# Patient Record
Sex: Female | Born: 1955 | Race: Black or African American | Hispanic: No | Marital: Single | Smoking: Current every day smoker
Health system: Southern US, Community
[De-identification: ages and names within clinical notes are randomized; demographics above are authoritative.]

## PROBLEM LIST (undated history)

## (undated) DIAGNOSIS — R87619 Unspecified abnormal cytological findings in specimens from cervix uteri: Secondary | ICD-10-CM

## (undated) DIAGNOSIS — N200 Calculus of kidney: Secondary | ICD-10-CM

## (undated) DIAGNOSIS — M1710 Unilateral primary osteoarthritis, unspecified knee: Secondary | ICD-10-CM

## (undated) DIAGNOSIS — E049 Nontoxic goiter, unspecified: Secondary | ICD-10-CM

## (undated) DIAGNOSIS — H269 Unspecified cataract: Secondary | ICD-10-CM

## (undated) DIAGNOSIS — M179 Osteoarthritis of knee, unspecified: Secondary | ICD-10-CM

## (undated) DIAGNOSIS — I10 Essential (primary) hypertension: Secondary | ICD-10-CM

## (undated) HISTORY — DX: Calculus of kidney: N20.0

## (undated) HISTORY — DX: Unspecified abnormal cytological findings in specimens from cervix uteri: R87.619

## (undated) HISTORY — PX: THYROID SURGERY: SHX805

## (undated) HISTORY — PX: ANKLE ARTHROPLASTY: SUR68

## (undated) HISTORY — DX: Nontoxic goiter, unspecified: E04.9

## (undated) HISTORY — DX: Unspecified cataract: H26.9

## (undated) HISTORY — DX: Unilateral primary osteoarthritis, unspecified knee: M17.10

## (undated) HISTORY — DX: Osteoarthritis of knee, unspecified: M17.9

## (undated) HISTORY — DX: Essential (primary) hypertension: I10

## (undated) HISTORY — PX: ANKLE SURGERY: SHX546

## (undated) HISTORY — PX: KIDNEY STONE SURGERY: SHX686

## (undated) HISTORY — PX: KNEE SURGERY: SHX244

---

## 2017-01-29 HISTORY — PX: ANKLE ARTHROPLASTY: SUR68

## 2017-11-08 ENCOUNTER — Ambulatory Visit: Payer: Self-pay | Admitting: Family Medicine

## 2017-12-23 ENCOUNTER — Ambulatory Visit: Payer: Self-pay | Admitting: Family Medicine

## 2018-08-14 NOTE — Progress Notes (Addendum)
Virtual Visit via Video Note  I connected with Anna Pratt Pratt   on 08/15/18 at  9:00 AM EDT by a video enabled telemedicine application and verified that I am speaking with the correct person using two identifiers.  Location patient: home Location provider:work office Persons participating in the virtual visit: patient, provider  I discussed the limitations of evaluation and management by telemedicine and the availability of in person appointments. The patient expressed understanding and agreed to proceed.  Unable to complete video visit, so converted to telephone visit   Anna Pratt Pratt DOB: 1955/03/13 Encounter date: 08/15/2018  This is a 63 y.o. female who presents to establish care.  No chief complaint on file.   History of present illness: Moved here a year ago in August and needed to establish care. [place] Daughter got job as [employer] and had just had infant, so she moved here to be closer to daughter.   Due for refills on medication by end of month for thyroid. Last time she had bloodwork done was last year.   HTN: maxzide-25 daily. Doesn't check blood pressures at home.  Hypothyroid: takes name brand synthroid 200mcg daily. Knee arthritis - they are in process of evaluating for knee replacement. Follows with Dr. Eulah PontMurphy. Tries to limit naproxen because it was elevating blood pressures. Getting injections. Hasn't tried voltaren get but is using arthritis cream.   Had colonoscopy she thinks last year. She does have records which she will bring to next appointment.  Had mammogram last year.  Pap smear - thinks about 3 years ago.   Was walking a lot before moving here; but having harder time with this due to knee. Just moved to subdivision and now will start back with walking- just trying to do this on flat surface.   Past Medical History:  Diagnosis Date  . Hypertension   . Osteoarthritis, knee   . Thyroid goiter    Past Surgical History:  Procedure  Laterality Date  . ANKLE ARTHROPLASTY     done after injury  . CESAREAN SECTION    . KIDNEY STONE SURGERY    . THYROID SURGERY     due to goiter 32 years ago   No Known Allergies Current Meds  Medication Sig  . triamterene-hydrochlorothiazide (MAXZIDE-25) 37.5-25 MG tablet Take 1 tablet by mouth daily.  . [DISCONTINUED] Levothyroxine Sodium (SYNTHROID PO) Take by mouth.   Social History   Tobacco Use  . Smoking status: Former Smoker    Packs/day: 0.50    Years: 42.00    Pack years: 21.00    Types: Cigarettes  . Smokeless tobacco: Never Used  Substance Use Topics  . Alcohol use: Yes    Comment: socially   Family History  Problem Relation Age of Onset  . Stroke Father   . Heart attack Father   . Diabetes Maternal Grandmother   . Diabetes Mellitus II Maternal Aunt   . Healthy Daughter      Review of Systems  Constitutional: Negative for chills, fatigue and fever.  Respiratory: Negative for cough, chest tightness, shortness of breath and wheezing.   Cardiovascular: Negative for chest pain, palpitations and leg swelling.    Objective:  There were no vitals taken for this visit.      BP Readings from Last 3 Encounters:  No data found for BP   Wt Readings from Last 3 Encounters:  No data found for Wt    EXAM:  GENERAL: alert, oriented, sounds well and in no acute  distress LUNG: no shortness of breath on phone  Had to do telephone visit; so exam limited.  Assessment/Plan   1. Hypertension, unspecified type Continue current medication. Will check baseline bloodwork.  - CBC with Differential/Platelet; Future - Comprehensive metabolic panel; Future  2. Lipid screening - Lipid panel; Future  3. Screening for diabetes mellitus Family history  4. Hypothyroidism, unspecified type Will check bloodwork. - SYNTHROID 200 MCG tablet; Take 1 tablet (200 mcg total) by mouth daily before breakfast.  Dispense: 90 tablet; Refill: 1 - TSH; Future  5. Breast  cancer screening by mammogram - MM DIGITAL SCREENING BILATERAL; Future  Return for physical exam (will schedule after bloodwork).     I discussed the assessment and treatment plan with the patient. The patient was provided an opportunity to ask questions and all were answered. The patient agreed with the plan and demonstrated an understanding of the instructions.   The patient was advised to call back or seek an in-person evaluation if the symptoms worsen or if the condition fails to improve as anticipated.  I provided 28 minutes of non-face-to-face time during this encounter.   Micheline Rough, MD

## 2018-08-15 ENCOUNTER — Other Ambulatory Visit: Payer: Self-pay

## 2018-08-15 ENCOUNTER — Encounter: Payer: Self-pay | Admitting: Family Medicine

## 2018-08-15 ENCOUNTER — Telehealth: Payer: Self-pay | Admitting: *Deleted

## 2018-08-15 ENCOUNTER — Ambulatory Visit (INDEPENDENT_AMBULATORY_CARE_PROVIDER_SITE_OTHER): Payer: BC Managed Care – PPO | Admitting: Family Medicine

## 2018-08-15 DIAGNOSIS — I10 Essential (primary) hypertension: Secondary | ICD-10-CM | POA: Diagnosis not present

## 2018-08-15 DIAGNOSIS — Z1231 Encounter for screening mammogram for malignant neoplasm of breast: Secondary | ICD-10-CM

## 2018-08-15 DIAGNOSIS — Z131 Encounter for screening for diabetes mellitus: Secondary | ICD-10-CM

## 2018-08-15 DIAGNOSIS — Z13220 Encounter for screening for lipoid disorders: Secondary | ICD-10-CM

## 2018-08-15 DIAGNOSIS — E039 Hypothyroidism, unspecified: Secondary | ICD-10-CM | POA: Diagnosis not present

## 2018-08-15 MED ORDER — SYNTHROID 200 MCG PO TABS: 200.0000 ug | ORAL_TABLET | Freq: Every day | ORAL | 1 refills | Status: DC

## 2018-08-15 MED ORDER — NAPROXEN SODIUM 275 MG PO TABS: 275.0000 mg | ORAL_TABLET | Freq: Two times a day (BID) | ORAL | Status: DC

## 2018-08-15 NOTE — Telephone Encounter (Signed)
Appt scheduled for 7/24.

## 2018-08-15 NOTE — Telephone Encounter (Signed)
-----   Message from Caren Macadam, MD sent at 08/15/2018  9:39 AM EDT ----- Please schedule labwork when she is available

## 2018-08-22 ENCOUNTER — Other Ambulatory Visit: Payer: Self-pay

## 2018-08-22 ENCOUNTER — Other Ambulatory Visit (INDEPENDENT_AMBULATORY_CARE_PROVIDER_SITE_OTHER): Payer: BC Managed Care – PPO

## 2018-08-22 DIAGNOSIS — I10 Essential (primary) hypertension: Secondary | ICD-10-CM

## 2018-08-22 DIAGNOSIS — E039 Hypothyroidism, unspecified: Secondary | ICD-10-CM

## 2018-08-22 DIAGNOSIS — Z13220 Encounter for screening for lipoid disorders: Secondary | ICD-10-CM | POA: Diagnosis not present

## 2018-08-22 LAB — LIPID PANEL
Cholesterol: 177 mg/dL (ref 0–200)
HDL: 61.7 mg/dL (ref >39.00)
LDL Cholesterol: 99 mg/dL (ref 0–99)
NonHDL: 115.18
Total CHOL/HDL Ratio: 3
Triglycerides: 80 mg/dL (ref 0.0–149.0)
VLDL: 16 mg/dL (ref 0.0–40.0)

## 2018-08-22 LAB — COMPREHENSIVE METABOLIC PANEL
ALT: 20 U/L (ref 0–35)
AST: 22 U/L (ref 0–37)
Albumin: 4.4 g/dL (ref 3.5–5.2)
Alkaline Phosphatase: 73 U/L (ref 39–117)
BUN: 19 mg/dL (ref 6–23)
CO2: 29 mEq/L (ref 19–32)
Calcium: 9.7 mg/dL (ref 8.4–10.5)
Chloride: 103 mEq/L (ref 96–112)
Creatinine, Ser: 0.79 mg/dL (ref 0.40–1.20)
GFR: 88.8 mL/min (ref >60.00)
Glucose, Bld: 91 mg/dL (ref 70–99)
Potassium: 3.5 mEq/L (ref 3.5–5.1)
Sodium: 140 mEq/L (ref 135–145)
Total Bilirubin: 0.4 mg/dL (ref 0.2–1.2)
Total Protein: 7.4 g/dL (ref 6.0–8.3)

## 2018-08-22 LAB — CBC WITH DIFFERENTIAL/PLATELET
Basophils Absolute: 0 10*3/uL (ref 0.0–0.1)
Basophils Relative: 0.2 % (ref 0.0–3.0)
Eosinophils Absolute: 0.2 10*3/uL (ref 0.0–0.7)
Eosinophils Relative: 2.6 % (ref 0.0–5.0)
HCT: 42.5 % (ref 36.0–46.0)
Hemoglobin: 14.4 g/dL (ref 12.0–15.0)
Lymphocytes Relative: 31.2 % (ref 12.0–46.0)
Lymphs Abs: 2.6 10*3/uL (ref 0.7–4.0)
MCHC: 33.8 g/dL (ref 30.0–36.0)
MCV: 87.3 fl (ref 78.0–100.0)
Monocytes Absolute: 0.5 10*3/uL (ref 0.1–1.0)
Monocytes Relative: 5.7 % (ref 3.0–12.0)
Neutro Abs: 5.1 10*3/uL (ref 1.4–7.7)
Neutrophils Relative %: 60.3 % (ref 43.0–77.0)
Platelets: 241 10*3/uL (ref 150.0–400.0)
RBC: 4.87 Mil/uL (ref 3.87–5.11)
RDW: 14.5 % (ref 11.5–15.5)
WBC: 8.5 10*3/uL (ref 4.0–10.5)

## 2018-08-22 LAB — TSH: TSH: 0.08 u[IU]/mL — ABNORMAL LOW (ref 0.35–4.50)

## 2018-08-28 ENCOUNTER — Encounter: Payer: Self-pay | Admitting: Family Medicine

## 2018-10-31 ENCOUNTER — Other Ambulatory Visit (INDEPENDENT_AMBULATORY_CARE_PROVIDER_SITE_OTHER): Payer: BC Managed Care – PPO

## 2018-10-31 ENCOUNTER — Other Ambulatory Visit: Payer: Self-pay

## 2018-10-31 ENCOUNTER — Other Ambulatory Visit: Payer: BC Managed Care – PPO

## 2018-10-31 ENCOUNTER — Other Ambulatory Visit: Payer: Self-pay | Admitting: Family Medicine

## 2018-10-31 DIAGNOSIS — E039 Hypothyroidism, unspecified: Secondary | ICD-10-CM

## 2018-10-31 LAB — TSH: TSH: 0.26 u[IU]/mL — ABNORMAL LOW (ref 0.35–4.50)

## 2018-11-07 ENCOUNTER — Ambulatory Visit (INDEPENDENT_AMBULATORY_CARE_PROVIDER_SITE_OTHER): Payer: BC Managed Care – PPO | Admitting: Family Medicine

## 2018-11-07 ENCOUNTER — Encounter: Payer: Self-pay | Admitting: Family Medicine

## 2018-11-07 ENCOUNTER — Other Ambulatory Visit: Payer: Self-pay

## 2018-11-07 VITALS — BP 140/80 | HR 76 | Temp 97.6°F | Ht 61.0 in | Wt 190.3 lb

## 2018-11-07 DIAGNOSIS — I10 Essential (primary) hypertension: Secondary | ICD-10-CM

## 2018-11-07 DIAGNOSIS — E039 Hypothyroidism, unspecified: Secondary | ICD-10-CM | POA: Diagnosis not present

## 2018-11-07 DIAGNOSIS — Z0000 Encounter for general adult medical examination without abnormal findings: Secondary | ICD-10-CM | POA: Diagnosis not present

## 2018-11-07 DIAGNOSIS — Z720 Tobacco use: Secondary | ICD-10-CM | POA: Diagnosis not present

## 2018-11-07 LAB — TSH: TSH: 0.23 u[IU]/mL — ABNORMAL LOW (ref 0.35–4.50)

## 2018-11-07 MED ORDER — BUPROPION HCL ER (SR) 150 MG PO TB12: ORAL_TABLET | ORAL | 2 refills | Status: DC

## 2018-11-07 NOTE — Patient Instructions (Addendum)
Please check your blood pressures at home and keep me updated with how these look in about 2 weeks time.    Discussed negative health consequences of smoking and benefits of smoking cessation with patient in detail. Discussed medical treatment options including patches, gums, zyban, and chantix. Encouraged to let me know if any concerns or if needing help with quitting. 1-800-QUIT-NOW also available to help through quitting process.

## 2018-11-07 NOTE — Progress Notes (Signed)
Anna Pratt DOB: [birth year] Encounter date: 11/07/2018  This is a 63 y.o. female who presents for complete physical   History of present illness/Additional concerns: Doing pretty well overall. Just planning to get back to walking and wants to lose weight again.   From last visit: HTN: maxzide-25 daily. Doesn't check blood pressures at home. Surprised by the 140 today; that is high for her.   Hypothyroid: takes name brand synthroid daily.  Knee arthritis - they are in process of evaluating for knee replacement. Follows with Dr. Eulah Pont. Tries to limit naproxen because it was elevating blood pressures. Getting injections. Hasn't tried voltaren get but is using arthritis cream. Thinks she is going to try to wait until she is 65.   Had colonoscopy she thinks last year. She brought all records here and they were copied. I have not yet seen/reviewed these.  Had mammogram last year.  Mammogram was ordered at last visit, but she states she did not hear about this.  Telephone number for [facility] center given to her today so she can call to set up mammogram since order is still in the system.  Pap smear - thinks about 3 years ago (this was just pelvic exam and may not have been pap smear). She would like to hold off on pap smear today so we will review previous records and then can give her future appointment.   Tried chantix in past; didn't feel like it helped her when she took it before.   Past Medical History:  Diagnosis Date  . Hypertension   . Osteoarthritis, knee   . Thyroid goiter    Past Surgical History:  Procedure Laterality Date  . ANKLE ARTHROPLASTY     done after injury  . CESAREAN SECTION    . KIDNEY STONE SURGERY    . THYROID SURGERY     due to goiter 32 years ago   No Known Allergies Current Meds  Medication Sig  . Omega-3 Fatty Acids (FISH OIL PO) Take by mouth.   Social History   Tobacco Use  . Smoking status: Current Every  Day Smoker    Packs/day: 0.50    Years: 42.00    Pack years: 21.00    Types: Cigarettes  . Smokeless tobacco: Never Used  Substance Use Topics  . Alcohol use: Yes    Comment: socially   Family History  Problem Relation Age of Onset  . Stroke Father   . Heart attack Father   . Diabetes Maternal Grandmother   . Diabetes Mellitus II Maternal Aunt   . Healthy Daughter      Review of Systems  Constitutional: Negative for activity change, appetite change, chills, fatigue, fever and unexpected weight change.  HENT: Negative for congestion, ear pain, hearing loss, sinus pressure, sinus pain, sore throat and trouble swallowing.   Eyes: Negative for pain and visual disturbance.  Respiratory: Negative for cough, chest tightness, shortness of breath and wheezing.   Cardiovascular: Negative for chest pain, palpitations and leg swelling.  Gastrointestinal: Negative for abdominal pain, blood in stool, constipation, diarrhea, nausea and vomiting.  Genitourinary: Negative for difficulty urinating and menstrual problem.  Musculoskeletal: Negative for arthralgias and back pain.  Skin: Negative for rash.  Neurological: Negative for dizziness, weakness, numbness and headaches.  Hematological: Negative for adenopathy. Does not bruise/bleed easily.  Psychiatric/Behavioral: Negative for sleep disturbance and suicidal ideas. The patient is not nervous/anxious.     CBC:  Lab Results  Component Value Date  WBC 8.5 08/22/2018   HGB 14.4 08/22/2018   HCT 42.5 08/22/2018   MCHC 33.8 08/22/2018   RDW 14.5 08/22/2018   PLT 241.0 08/22/2018   CMP: Lab Results  Component Value Date   NA 140 08/22/2018   K 3.5 08/22/2018   CL 103 08/22/2018   CO2 29 08/22/2018   GLUCOSE 91 08/22/2018   BUN 19 08/22/2018   CREATININE 0.79 08/22/2018   CALCIUM 9.7 08/22/2018   PROT 7.4 08/22/2018   BILITOT 0.4 08/22/2018   ALKPHOS 73 08/22/2018   ALT 20 08/22/2018   AST 22 08/22/2018   LIPID: Lab Results   Component Value Date   CHOL 177 08/22/2018   TRIG 80.0 08/22/2018   HDL 61.70 08/22/2018   LDLCALC 99 08/22/2018    Objective:  BP 140/80 (BP Location: Left Arm, Patient Position: Sitting, Cuff Size: Large)   Pulse 76   Temp 97.6 F (36.4 C) (Temporal)   Ht 5\' 1"  (1.549 m)   Wt 190 lb 4.8 oz (86.3 kg)   SpO2 98%   BMI 35.96 kg/m   Weight: 190 lb 4.8 oz (86.3 kg)   BP Readings from Last 3 Encounters:  11/07/18 140/80   Wt Readings from Last 3 Encounters:  11/07/18 190 lb 4.8 oz (86.3 kg)    Physical Exam Constitutional:      General: She is not in acute distress.    Appearance: She is well-developed.  HENT:     Head: Normocephalic and atraumatic.     Right Ear: External ear normal.     Left Ear: External ear normal.     Mouth/Throat:     Pharynx: No oropharyngeal exudate.  Eyes:     Conjunctiva/sclera: Conjunctivae normal.     Pupils: Pupils are equal, round, and reactive to light.  Neck:     Musculoskeletal: Normal range of motion and neck supple.     Thyroid: No thyromegaly.  Cardiovascular:     Rate and Rhythm: Normal rate and regular rhythm.     Heart sounds: Normal heart sounds. No murmur. No friction rub. No gallop.   Pulmonary:     Effort: Pulmonary effort is normal.     Breath sounds: Normal breath sounds.  Abdominal:     General: Bowel sounds are normal. There is no distension.     Palpations: Abdomen is soft. There is no mass.     Tenderness: There is no abdominal tenderness. There is no guarding.     Hernia: No hernia is present.  Musculoskeletal: Normal range of motion.        General: No tenderness or deformity.  Lymphadenopathy:     Cervical: No cervical adenopathy.  Skin:    General: Skin is warm and dry.     Findings: No rash.     Comments: Left anterior mid lower leg - 0.5cm dark brown mole, regular shaped. Right buttock 0.5cm dark brown mole  Neurological:     Mental Status: She is alert and oriented to person, place, and time.      Deep Tendon Reflexes: Reflexes normal.     Reflex Scores:      Tricep reflexes are 2+ on the right side and 2+ on the left side.      Bicep reflexes are 2+ on the right side and 2+ on the left side.      Brachioradialis reflexes are 2+ on the right side and 2+ on the left side.      Patellar reflexes are  2+ on the right side and 2+ on the left side. Psychiatric:        Speech: Speech normal.        Behavior: Behavior normal.        Thought Content: Thought content normal.     Assessment/Plan: Health Maintenance Due  Topic Date Due  . PAP SMEAR-Modifier  01/30/2016   Health Maintenance reviewed.  1. Preventative health care Number given for her to call and schedule mammogram.   2. Hypothyroidism, unspecified type We will recheck thyroid today to make sure new dosing is keeping her in normal range.  3. Hypertension, unspecified type Blood pressure is slightly high today.  Have asked her to check at home for 2 weeks and report numbers back.  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  4. Tobacco abuse We discussed smoking.  She does have some desire to quit.  She has quit in the past for 2 years and did this using e-cigarettes.  She has tried Chantix and did not feel like it worked very well for her.  We discussed trying Wellbutrin today and she is willing to try this.  Discussed cutting down smoking to her "critical" cigarettes a day and then starting with the Wellbutrin.    Return for bp update in 2 weeks time.  Theodis ShoveJunell Elihu Milstein, MD

## 2018-11-08 ENCOUNTER — Other Ambulatory Visit: Payer: Self-pay | Admitting: Family Medicine

## 2018-11-08 MED ORDER — LEVOTHYROXINE SODIUM 150 MCG PO TABS: 150.0000 ug | ORAL_TABLET | Freq: Every day | ORAL | 3 refills | Status: DC

## 2018-11-10 ENCOUNTER — Other Ambulatory Visit: Payer: Self-pay | Admitting: Family Medicine

## 2018-11-10 MED ORDER — SYNTHROID 150 MCG PO TABS: 150.0000 ug | ORAL_TABLET | Freq: Every day | ORAL | 1 refills | Status: DC

## 2018-11-10 NOTE — Addendum Note (Signed)
Addended by: Agnes Lawrence on: 11/10/2018 11:40 AM   Modules accepted: Orders

## 2018-11-12 ENCOUNTER — Telehealth: Payer: Self-pay | Admitting: *Deleted

## 2018-11-12 NOTE — Telephone Encounter (Signed)
Please get details of this. We did discuss knee pain at visit. Ideally a note limiting duties related to joint would come from specialist treating her for this? But if they are not willing to complete I can try to help with more information.

## 2018-11-12 NOTE — Telephone Encounter (Signed)
I called the pt and informed her of the message below.  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

## 2018-11-12 NOTE — Telephone Encounter (Signed)
Copied from [place] [id number]. Topic: General - Other >> Nov 12, 2018  9:54 AM Anna Pratt wrote: Reason for CRM: Pt has arthritis in her knees and cant perform certain duties at work and was advised by employer to get a DR. Note / please call Pt for more information for the note

## 2018-11-12 NOTE — Telephone Encounter (Signed)
Sounds good. I do not think ortho will have issue writing note for her at that visit.

## 2018-12-05 ENCOUNTER — Telehealth (INDEPENDENT_AMBULATORY_CARE_PROVIDER_SITE_OTHER): Payer: BC Managed Care – PPO | Admitting: Family Medicine

## 2018-12-05 ENCOUNTER — Encounter: Payer: Self-pay | Admitting: Family Medicine

## 2018-12-05 ENCOUNTER — Other Ambulatory Visit: Payer: Self-pay

## 2018-12-05 VITALS — BP 116/77 | HR 81

## 2018-12-05 DIAGNOSIS — I10 Essential (primary) hypertension: Secondary | ICD-10-CM | POA: Insufficient documentation

## 2018-12-05 DIAGNOSIS — E039 Hypothyroidism, unspecified: Secondary | ICD-10-CM

## 2018-12-05 DIAGNOSIS — Z01818 Encounter for other preprocedural examination: Secondary | ICD-10-CM

## 2018-12-05 DIAGNOSIS — E890 Postprocedural hypothyroidism: Secondary | ICD-10-CM | POA: Insufficient documentation

## 2018-12-05 DIAGNOSIS — Z720 Tobacco use: Secondary | ICD-10-CM

## 2018-12-05 NOTE — Progress Notes (Signed)
Virtual Visit via Video Note  I connected with Anna Pratt  on 12/05/18 at 11:00 AM EST by a video enabled telemedicine application and verified that I am speaking with the correct person using two identifiers.  Location patient: home Location provider:work or home office Persons participating in the virtual visit: patient, provider  I discussed the limitations of evaluation and management by telemedicine and the availability of in person appointments. The patient expressed understanding and agreed to proceed.   [place] Date of Birth:  10-Dec-1955  This patient presents today for a preoperative consultation at the request of surgeon, Dr. Percell Miller, who plans on performing right total knee replacement on date: To be determined.  She was asked to have a follow-up visit before preoperative paperwork to be completed because her blood pressure has been elevated at her last visit.  Just can't wait any longer for knee surgery; ready to get this done.   Doing well with smoking in generally - doing 2-3 cig/day. She is doing the wellbutrin.   Checked blood pressure this morning when Wendie Simmer called: 117/77. Recheck while on the phone: 131/84 HR 79  Planned anesthesia: general  Known anesthesia problems: Negative  Bleeding risk: none Personal or FH of DVT/PE: none    Patient Active Problem List   Diagnosis Date Noted  . Hypertension 12/05/2018  . Hypothyroid 12/05/2018   Past Surgical History:  Procedure Laterality Date  . ANKLE ARTHROPLASTY     done after injury  . CESAREAN SECTION    . KIDNEY STONE SURGERY    . THYROID SURGERY     due to goiter 32 years ago    No Known Allergies Current Meds  Medication Sig  . Omega-3 Fatty Acids (FISH OIL PO) Take by mouth.  . SYNTHROID 150 MCG tablet Take 1 tablet (150 mcg total) by mouth daily before breakfast.  . triamterene-hydrochlorothiazide (MAXZIDE-25) 37.5-25 MG tablet Take 1 tablet by mouth daily.     Social History   Tobacco Use  . Smoking status: Current Every Day Smoker    Packs/day: 0.50    Years: 42.00    Pack years: 21.00    Types: Cigarettes  . Smokeless tobacco: Never Used  Substance Use Topics  . Alcohol use: Yes    Comment: socially   Family History  Problem Relation Age of Onset  . Stroke Father   . Heart attack Father   . Diabetes Maternal Grandmother   . Diabetes Mellitus II Maternal Aunt   . Healthy Daughter     Review of Systems Review of Systems - General ROS: negative for - chills, fatigue, fever or hot flashes ENT ROS: negative for - nasal congestion, nasal discharge, sinus pain or sore throat Respiratory ROS: negative for - cough or shortness of breath Cardiovascular ROS: no chest pain or dyspnea on exertion Musculoskeletal ROS: positive for - joint pain   Recent Labs: CBC:  Lab Results  Component Value Date   WBC 8.5 08/22/2018   HGB 14.4 08/22/2018   HCT 42.5 08/22/2018   MCHC 33.8 08/22/2018   RDW 14.5 08/22/2018   PLT 241.0 08/22/2018   CMP:  Lab Results  Component Value Date   NA 140 08/22/2018   K 3.5 08/22/2018   CL 103 08/22/2018   CO2 29 08/22/2018   GLUCOSE 91 08/22/2018   BUN 19 08/22/2018   CREATININE 0.79 08/22/2018   CALCIUM 9.7 08/22/2018   PROT 7.4 08/22/2018   BILITOT 0.4 08/22/2018   ALKPHOS  73 08/22/2018   ALT 20 08/22/2018   AST 22 08/22/2018    HBA1C: No results found for: LABA1C, EAG  Objective:   BP 116/77   Pulse 81     Physical Exam   EKG Interpretation: virtual exam; based on medical history, EKG not indicated.  Lab Review: stable in July.    Assessment:   [name] was seen today for medical clearance.  Diagnoses and all orders for this visit:  Encounter for preoperative assessment  Essential hypertension: stable on current medication.   Hypothyroidism, unspecified type: stable on synthroid  Tobacco abuse: she is going to work on cutting out final cigarettes. Has decreased  amount smoking significantly.   63 y.o.patient  approved for Surgery     Plan:   1. Preoperative workup as follows:none 2. Change in medication regimen before surgery: none 3. No contraindications to planned surgery:we discussed benefits of entirely quitting smoking prior to surgery. She understands this and is going to work to cut out final 2-3 cig/day ASAP to promote improved breathing and wound healing.    lower than normal medical risk using [employer] guildeline of perioperative cardiovascular evaluation  Note electronically signed by provider.  Theodis Shove, MD   I discussed the assessment and treatment plan with the patient. The patient was provided an opportunity to ask questions and all were answered. The patient agreed with the plan and demonstrated an understanding of the instructions.   The patient was advised to call back or seek an in-person evaluation if the symptoms worsen or if the condition fails to improve as anticipated.  I provided 12 minutes of non-face-to-face time during this encounter.   Theodis Shove, MD

## 2018-12-19 ENCOUNTER — Ambulatory Visit: Payer: Self-pay | Admitting: *Deleted

## 2018-12-19 NOTE — Telephone Encounter (Signed)
I called patient back. She is feeling fine, but bp still elevated at 170/90. Pain level is about 6-7/10 right now. She is taking pain medication every 4 hours currently.  I advised her to take an extra Maxide this evening and recheck her blood pressure in 2 hours.  She can report these numbers at her virtual visit tomorrow.  We may need to either increase the Maxide dose or add on an additional medication.  We discussed that blood pressure can change with fluid changes as well as pain postoperatively.  She will continue to monitor.

## 2018-12-19 NOTE — Telephone Encounter (Signed)
Patient scheduled for 12/20/2018

## 2018-12-19 NOTE — Telephone Encounter (Signed)
Pt called with having an elevated b/p after knee surgery yesterday.  Her systolic has been up to 759 while she was in the medical facility following her surgery. Today at physical therapy her systolic was between 163 and 178.  She rechecked her b/p now and it is 172/91. She denies headache, chest pain, shortness of breath, blurred vision or weakness. She is having some pain from her knee. And she has not missed any doses of her b/p medication. Per protocol she should be seen within 3 days. Virtual appointment scheduled for tomorrow. She is also advised to have her list of medications for the provider to see. She has meds that have been ordered for her to take after her surgery. She is advised that if she starts having symptoms mentioned above with elevated b/p, she should go to the hospital. She voiced understanding. Reason for Disposition . Systolic BP  >= 846 OR Diastolic >= 659  Answer Assessment - Initial Assessment Questions 1. BLOOD PRESSURE: "What is the blood pressure?" "Did you take at least two measurements 5 minutes apart?"     178/100 when leaving therapy and it was 162/94 this morning 2. ONSET: "When did you take your blood pressure?"     today 3. HOW: "How did you obtain the blood pressure?" (e.g., visiting nurse, automatic home BP monitor)     Automatic home BP 4. HISTORY: "Do you have a history of high blood pressure?"     yes 5. MEDICATIONS: "Are you taking any medications for blood pressure?" "Have you missed any doses recently?"     On b/p medicaton 6. OTHER SYMPTOMS: "Do you have any symptoms?" (e.g., headache, chest pain, blurred vision, difficulty breathing, weakness)     no 7. PREGNANCY: "Is there any chance you are pregnant?" "When was your last menstrual period?"     N/a  Protocols used: HIGH BLOOD PRESSURE-A-AH

## 2018-12-20 ENCOUNTER — Ambulatory Visit (INDEPENDENT_AMBULATORY_CARE_PROVIDER_SITE_OTHER): Payer: BC Managed Care – PPO | Admitting: Family Medicine

## 2018-12-20 ENCOUNTER — Encounter: Payer: Self-pay | Admitting: Family Medicine

## 2018-12-20 VITALS — BP 159/88 | HR 89 | Ht 61.0 in | Wt 185.0 lb

## 2018-12-20 DIAGNOSIS — I10 Essential (primary) hypertension: Secondary | ICD-10-CM

## 2018-12-20 MED ORDER — AMLODIPINE BESYLATE 5 MG PO TABS: 5.0000 mg | ORAL_TABLET | Freq: Every day | ORAL | 2 refills | Status: DC

## 2018-12-20 NOTE — Progress Notes (Signed)
Virtual Visit via Video Note  I connected with Anna Pratt on 12/20/18 at  9:40 AM EST by a video enabled telemedicine application and verified that I am speaking with the correct person using two identifiers.  Location: Patient: home with daughter  Provider: home    I discussed the limitations of evaluation and management by telemedicine and the availability of in person appointments. The patient expressed understanding and agreed to proceed.  History of Present Illness: Pt is home c/o bp running high--- it started the day of her knee replacement and con't during hosp stay.  She was not having pain at the time.  She used to take other meds but it was stopped when she lost weight and bp came down.  She does not remember what it was    No HA, no cp, no sob  No other complaints--- pain in knee is pretty well controlled    Observations/Objective: Vitals:   12/20/18 0934  BP: (!) 159/88  Pulse: 89   Pt is in NAD  Assessment and Plan: 1. Essential hypertension con't maxzide and add norvasc F/u pcp in 2-3 weeks or sooner prn  - amLODipine (NORVASC) 5 MG tablet; Take 1 tablet (5 mg total) by mouth daily.  Dispense: 30 tablet; Refill: 2  Poorly controlled will alter medications, encouraged DASH diet, minimize caffeine and obtain adequate sleep. Report concerning symptoms and follow up as directed and as needed  Follow Up Instructions:    I discussed the assessment and treatment plan with the patient. The patient was provided an opportunity to ask questions and all were answered. The patient agreed with the plan and demonstrated an understanding of the instructions.   The patient was advised to call back or seek an in-person evaluation if the symptoms worsen or if the condition fails to improve as anticipated.  I provided 15 minutes of non-face-to-face time during this encounter.   Ann Held, DO

## 2019-01-12 ENCOUNTER — Other Ambulatory Visit: Payer: BC Managed Care – PPO

## 2019-02-02 ENCOUNTER — Other Ambulatory Visit: Payer: BC Managed Care – PPO

## 2019-02-03 ENCOUNTER — Other Ambulatory Visit: Payer: Self-pay

## 2019-02-03 ENCOUNTER — Other Ambulatory Visit (INDEPENDENT_AMBULATORY_CARE_PROVIDER_SITE_OTHER): Payer: BC Managed Care – PPO

## 2019-02-03 DIAGNOSIS — E039 Hypothyroidism, unspecified: Secondary | ICD-10-CM | POA: Diagnosis not present

## 2019-02-03 LAB — TSH: TSH: 6.51 u[IU]/mL — ABNORMAL HIGH (ref 0.35–4.50)

## 2019-02-10 MED ORDER — SYNTHROID 175 MCG PO TABS: 175.0000 ug | ORAL_TABLET | Freq: Every day | ORAL | 5 refills | Status: DC

## 2019-03-10 ENCOUNTER — Other Ambulatory Visit: Payer: Self-pay

## 2019-03-10 ENCOUNTER — Encounter: Payer: Self-pay | Admitting: Family Medicine

## 2019-03-10 ENCOUNTER — Telehealth (INDEPENDENT_AMBULATORY_CARE_PROVIDER_SITE_OTHER): Payer: BC Managed Care – PPO | Admitting: Family Medicine

## 2019-03-10 ENCOUNTER — Telehealth: Payer: Self-pay | Admitting: Family Medicine

## 2019-03-10 VITALS — Temp 100.1°F

## 2019-03-10 DIAGNOSIS — R0981 Nasal congestion: Secondary | ICD-10-CM

## 2019-03-10 DIAGNOSIS — R52 Pain, unspecified: Secondary | ICD-10-CM | POA: Diagnosis not present

## 2019-03-10 DIAGNOSIS — R509 Fever, unspecified: Secondary | ICD-10-CM

## 2019-03-10 MED ORDER — OSELTAMIVIR PHOSPHATE 75 MG PO CAPS: 75.0000 mg | ORAL_CAPSULE | Freq: Two times a day (BID) | ORAL | 0 refills | Status: DC

## 2019-03-10 NOTE — Progress Notes (Signed)
Virtual Visit via Video Note  I connected with Anna Pratt  on 03/10/19 at  5:20 PM EST by a video enabled telemedicine application and verified that I am speaking with the correct person using two identifiers.  Location patient: home Location provider:work or home office Persons participating in the virtual visit: patient, provider  I discussed the limitations of evaluation and management by telemedicine and the availability of in person appointments. The patient expressed understanding and agreed to proceed.   HPI:  Acute visit for sinus congestion: -started yesterday -symptoms: sinus congestion, low grade temp of 100.1 today, tired, body aches, loose bowel - mild, mild HA yesterday -denies: sob, cough, stuck in bed, weakness, CP, diarrhea, NV, loss of taste or smell -daughter had some sinus issues a few days ago - she works at the [employer] - she is getting a Therapist, occupational -pt goes to grocery store only - wears mask and socially distances -grandchild goes to another lady's house for daycare - no other children there -denies lung disease, diabetes or immune issues -reports does have HTN, but well controlled and thyroid dz  ROS: See pertinent positives and negatives per HPI.  Past Medical History:  Diagnosis Date  . Hypertension   . Osteoarthritis, knee   . Thyroid goiter     Past Surgical History:  Procedure Laterality Date  . ANKLE ARTHROPLASTY     done after injury  . CESAREAN SECTION    . KIDNEY STONE SURGERY    . THYROID SURGERY     due to goiter 32 years ago    Family History  Problem Relation Age of Onset  . Stroke Father   . Heart attack Father   . Diabetes Maternal Grandmother   . Diabetes Mellitus II Maternal Aunt   . Healthy Daughter     SOCIAL HX: see hpi   Current Outpatient Medications:  .  amLODipine (NORVASC) 5 MG tablet, Take 1 tablet (5 mg total) by mouth daily., Disp: 30 tablet, Rfl: 2 .  Omega-3 Fatty Acids (FISH OIL PO), Take by  mouth., Disp: , Rfl:  .  SYNTHROID 175 MCG tablet, Take 1 tablet (175 mcg total) by mouth daily before breakfast., Disp: 30 tablet, Rfl: 5 .  triamterene-hydrochlorothiazide (MAXZIDE-25) 37.5-25 MG tablet, Take 1 tablet by mouth daily., Disp: , Rfl:  .  oseltamivir (TAMIFLU) 75 MG capsule, Take 1 capsule (75 mg total) by mouth 2 (two) times daily., Disp: 10 capsule, Rfl: 0  EXAM:  VITALS per patient if applicable:see hpi  GENERAL: alert, oriented, appears well and in no acute distress  HEENT: atraumatic, conjunttiva clear, no obvious abnormalities on inspection of external nose and ears  NECK: normal movements of the head and neck  LUNGS: on inspection no signs of respiratory distress, breathing rate appears normal, no obvious gross SOB, gasping or wheezing  CV: no obvious cyanosis  MS: moves all visible extremities without noticeable abnormality  PSYCH/NEURO: pleasant and cooperative, no obvious depression or anxiety, speech and thought processing grossly intact  ASSESSMENT AND PLAN:  Discussed the following assessment and plan:  Sinus congestion  Fever and chills  Body aches  -we discussed possible serious and likely etiologies, options for evaluation and workup, limitations of telemedicine visit vs in person visit, treatment, treatment risks and precautions. Pt prefers to treat via telemedicine empirically rather then risking or undertaking an in person visit at this moment. Could have VURI, mild influenza, COVID19 vs other - likely from daughter whom has had symptoms. Discussed options.  Pt wants rx for tamiflu. She plans to do COVID19 testing and is considering [facility] vs a pharmacy. She agrees to home isolation. Discussed limitations of testing, options for treatment, symptomatic care, potential complications. Patient agrees to seek prompt in person care or follow up with PCP if worsening, new symptoms arise, or if is not improving with treatment. Agrees to follow up with PCP if  testing positive and wants to pursue mab.   I discussed the assessment and treatment plan with the patient. The patient was provided an opportunity to ask questions and all were answered. The patient agreed with the plan and demonstrated an understanding of the instructions.   The patient was advised to call back or seek an in-person evaluation if the symptoms worsen or if the condition fails to improve as anticipated.   Lucretia Kern, DO

## 2019-03-12 ENCOUNTER — Other Ambulatory Visit: Payer: Self-pay

## 2019-03-12 ENCOUNTER — Telehealth (INDEPENDENT_AMBULATORY_CARE_PROVIDER_SITE_OTHER): Payer: BC Managed Care – PPO | Admitting: Family Medicine

## 2019-03-12 ENCOUNTER — Encounter: Payer: Self-pay | Admitting: Family Medicine

## 2019-03-12 VITALS — BP 119/76 | HR 101 | Temp 99.7°F

## 2019-03-12 DIAGNOSIS — R0981 Nasal congestion: Secondary | ICD-10-CM | POA: Diagnosis not present

## 2019-03-12 DIAGNOSIS — R52 Pain, unspecified: Secondary | ICD-10-CM | POA: Diagnosis not present

## 2019-03-12 DIAGNOSIS — R509 Fever, unspecified: Secondary | ICD-10-CM

## 2019-03-12 NOTE — Progress Notes (Signed)
Virtual Visit via Video Note  I connected with Anna Pratt  on 03/12/19 at 10:00 AM EST by a video enabled telemedicine application and verified that I am speaking with the correct person using two identifiers.  Location patient: home Location provider:work or home office Persons participating in the virtual visit: patient, provider  I discussed the limitations of evaluation and management by telemedicine and the availability of in person appointments. The patient expressed understanding and agreed to proceed.   HPI:  Acute visit for concerns about COVID19: -symptoms started on Feb 8 -symptoms include sinus congestion, loose stool initially, mild body aches, low grade temps - highest 100.6 -see note from a few days ago, pt denies worsening symptoms -she had a covid test on the 9th, negative, but she wants to test again as she is most concerned about possible COVID -she was given tamiflu, but did not take it -temp today 100 -denies NVD, cough, sinus pain, discolored mucus, SOB, dysuria, inability to get out of bed, CP -daughter ha similar symptoms - she works at the [employer] - she was COVID tested as well -pt goes to grocery store only - wears mask and socially distances -grandchild goes to another lady's house for daycare - no other children there  ROS: See pertinent positives and negatives per HPI.  Past Medical History:  Diagnosis Date  . Hypertension   . Osteoarthritis, knee   . Thyroid goiter     Past Surgical History:  Procedure Laterality Date  . ANKLE ARTHROPLASTY     done after injury  . CESAREAN SECTION    . KIDNEY STONE SURGERY    . THYROID SURGERY     due to goiter 32 years ago    Family History  Problem Relation Age of Onset  . Stroke Father   . Heart attack Father   . Diabetes Maternal Grandmother   . Diabetes Mellitus II Maternal Aunt   . Healthy Daughter     SOCIAL HX: see hpi   Current Outpatient Medications:  .  amLODipine (NORVASC) 5 MG  tablet, Take 1 tablet (5 mg total) by mouth daily., Disp: 30 tablet, Rfl: 2 .  Omega-3 Fatty Acids (FISH OIL PO), Take by mouth., Disp: , Rfl:  .  oseltamivir (TAMIFLU) 75 MG capsule, Take 1 capsule (75 mg total) by mouth 2 (two) times daily., Disp: 10 capsule, Rfl: 0 .  SYNTHROID 175 MCG tablet, Take 1 tablet (175 mcg total) by mouth daily before breakfast., Disp: 30 tablet, Rfl: 5 .  triamterene-hydrochlorothiazide (MAXZIDE-25) 37.5-25 MG tablet, Take 1 tablet by mouth daily., Disp: , Rfl:   EXAM:  VITALS per patient if applicable: see hpi  GENERAL: alert, oriented, appears well and in no acute distress  HEENT: atraumatic, conjunttiva clear, no obvious abnormalities on inspection of external nose and ears  NECK: normal movements of the head and neck  LUNGS: on inspection no signs of respiratory distress, breathing rate appears normal, no obvious gross SOB, gasping or wheezing  CV: no obvious cyanosis  MS: moves all visible extremities without noticeable abnormality  PSYCH/NEURO: pleasant and cooperative, no obvious depression or anxiety, speech and thought processing grossly intact  ASSESSMENT AND PLAN:  Discussed the following assessment and plan:  Sinus congestion  Fever, unspecified fever cause  Body aches  -we discussed possible serious and likely etiologies, options for evaluation and workup, limitations of telemedicine visit vs in person visit, treatment, treatment risks and precautions. Pt prefers to treat via telemedicine empirically rather then risking or  undertaking an in person visit at this moment. Discussed limitations of COVID testing. She [facility] - information provided. May be better timing for testing in terms of COVID today or tomorrow. She continues to have mild symptoms with VURI, mild influenza, COVID vs other all on the differential. She has opted to cont symptomatic care, COVID re-test and agrees to seek prompt in person care if worsening, new  symptoms arise, or if is not improving with treatment.   I discussed the assessment and treatment plan with the patient. The patient was provided an opportunity to ask questions and all were answered. The patient agreed with the plan and demonstrated an understanding of the instructions.   The patient was advised to call back or seek an in-person evaluation if the symptoms worsen or if the condition fails to improve as anticipated.   Terressa Koyanagi, DO

## 2019-03-16 ENCOUNTER — Telehealth (INDEPENDENT_AMBULATORY_CARE_PROVIDER_SITE_OTHER): Payer: BC Managed Care – PPO | Admitting: Family Medicine

## 2019-03-16 ENCOUNTER — Telehealth: Payer: Self-pay | Admitting: Family Medicine

## 2019-03-16 ENCOUNTER — Encounter: Payer: Self-pay | Admitting: Family Medicine

## 2019-03-16 ENCOUNTER — Other Ambulatory Visit: Payer: Self-pay | Admitting: Nurse Practitioner

## 2019-03-16 ENCOUNTER — Telehealth: Payer: Self-pay | Admitting: Nurse Practitioner

## 2019-03-16 VITALS — Temp 98.5°F

## 2019-03-16 DIAGNOSIS — I10 Essential (primary) hypertension: Secondary | ICD-10-CM

## 2019-03-16 DIAGNOSIS — U071 COVID-19: Secondary | ICD-10-CM | POA: Diagnosis not present

## 2019-03-16 NOTE — Progress Notes (Signed)
  I connected by phone with Anna Pratt on 03/16/2019 at 8:16 PM to discuss the potential use of an new treatment for mild to moderate COVID-19 viral infection in non-hospitalized patients.  This patient is a 64 y.o. female that meets the FDA criteria for Emergency Use Authorization of bamlanivimab or casirivimab\imdevimab.  Has a (+) direct SARS-CoV-2 viral test result  Has mild or moderate COVID-19   Is ? 64 years of age and weighs ? 40 kg  Is NOT hospitalized due to COVID-19  Is NOT requiring oxygen therapy or requiring an increase in baseline oxygen flow rate due to COVID-19  Is within 10 days of symptom onset  Has at least one of the high risk factor(s) for progression to severe COVID-19 and/or hospitalization as defined in EUA.  Specific high risk criteria : Hypertension   I have spoken and communicated the following to the patient or parent/caregiver:  1. FDA has authorized the emergency use of bamlanivimab and casirivimab\imdevimab for the treatment of mild to moderate COVID-19 in adults and pediatric patients with positive results of direct SARS-CoV-2 viral testing who are 83 years of age and older weighing at least 40 kg, and who are at high risk for progressing to severe COVID-19 and/or hospitalization.  2. The significant known and potential risks and benefits of bamlanivimab and casirivimab\imdevimab, and the extent to which such potential risks and benefits are unknown.  3. Information on available alternative treatments and the risks and benefits of those alternatives, including clinical trials.  4. Patients treated with bamlanivimab and casirivimab\imdevimab should continue to self-isolate and use infection control measures (e.g., wear mask, isolate, social distance, avoid sharing personal items, clean and disinfect "high touch" surfaces, and frequent handwashing) according to CDC guidelines.   5. The patient or parent/caregiver has the option to accept or  refuse bamlanivimab or casirivimab\imdevimab .  After reviewing this information with the patient, The patient agreed to proceed with receiving the bamlanimivab infusion and will be provided a copy of the Fact sheet prior to receiving the infusion.   Nikki Pickenpack-Cousar 03/16/2019 8:16 PM

## 2019-03-16 NOTE — Progress Notes (Signed)
Virtual Visit via Telephone Note  I connected with Anna Pratt on 03/16/19 at  2:30 PM EST by telephone and verified that I am speaking with the correct person using two identifiers.   I discussed the limitations, risks, security and privacy concerns of performing an evaluation and management service by telephone and the availability of in person appointments. I also discussed with the patient that there may be a patient responsible charge related to this service. The patient expressed understanding and agreed to proceed.  Location patient: home Location provider: work office Participants present for the call: patient, provider Patient did not have a visit in the prior 7 days to address this/these issue(s).   History of Present Illness: Anna Pratt is a 64 yo female with hx of HTN,hypothyroidism,and obesity reporting positive COVID 19 test, received results yesterday. She has been sick since 03/09/19. + Fever, chills, body aches,decreased appetite, fatigue, nasal congestion, productive cough with clearish sputum, and diarrhea. Most symptoms are improving. Body aches and fever resolved.  She did not develop anosmia or ageusia.  Last temperature this morning 98.5 F. Loose stool this morning,no blood or mucus. Negative for abdominal pain,N/V,or urinary symptoms.  Her daughter and grandson tested positive for COVID-19, she was tested at the same time but she was negative.  She repeated test 3 days ago and it was positive. She denies headache, sore throat, CP, dyspnea, wheezing, or a skin rash. She has not tried OTC medications.  HTN on amlodipine 5 mg daily and triamterene-HCTZ 37.5-25 mg daily. BP has been well controlled. BMI 35.  + Smoker.  Observations/Objective: Patient sounds cheerful and well on the phone. I do not appreciate any SOB, dyspnea,mor wheezing. + Cough x 1 during visit. Speech and thought processing are grossly intact. Patient reported vitals:Temp  98.5 F (36.9 C) (Oral)   Assessment and Plan:  1. COVID-19 virus infection Educated about symptoms, possible complications, and treatment options. We discussed criteria for IV balamanivimab infusion. Symptoms are improving but she still would like to get monoclonal ab infusion. Information given,she will call now. Continue adequate hydration and quarantine. Clearly instructed about warning signs.  2. Essential hypertension Problem seems to be adequately controlled. No changes in current management.  Follow Up Instructions: Return if symptoms worsen or fail to improve.   I did not refer this patient for an OV in the next 24 hours for this/these issue(s).  I discussed the assessment and treatment plan with the patient. Anna Pratt was provided an opportunity to ask questions and all were answered. She agreed with the plan and demonstrated an understanding of the instructions.    I provided 14 minutes of non-face-to-face time during this encounter.   Marguerita Stapp [place], MD

## 2019-03-16 NOTE — Telephone Encounter (Signed)
Called to discuss with Anna Pratt about Covid symptoms and the use of bamlanivimab, a monoclonal antibody infusion for those with mild to moderate Covid symptoms and at a high risk of hospitalization.     Pt is qualified for this infusion at the [facility] infusion center due to co-morbid conditions (hypertension) and/or a member of an at-risk group.   Pt verbalized understanding of treatment and appointment details. Scheduled for 03/17/19 @ 1230pm as requested.   Patient Active Problem List   Diagnosis Date Noted  . Hypertension 12/05/2018  . Hypothyroid 12/05/2018    Willette Alma, AGPCNP-BC Pager: [phone] Amion: Thea Alken

## 2019-03-16 NOTE — Telephone Encounter (Signed)
Pt has a virtual visit with Dr.Jordan today, she has a positive covid test and would still like Dr. Fuller [place] to give her advice with how she should treat due to her being high risk.  Patient Phone: [phone]

## 2019-03-17 ENCOUNTER — Ambulatory Visit (HOSPITAL_COMMUNITY)
Admission: RE | Admit: 2019-03-17 | Discharge: 2019-03-17 | Disposition: A | Discharge status: Home / Self Care | Payer: BC Managed Care – PPO | Source: Ambulatory Visit | Attending: Pulmonary Disease | Admitting: Pulmonary Disease

## 2019-03-17 DIAGNOSIS — I10 Essential (primary) hypertension: Secondary | ICD-10-CM | POA: Insufficient documentation

## 2019-03-17 DIAGNOSIS — U071 COVID-19: Secondary | ICD-10-CM | POA: Diagnosis not present

## 2019-03-17 MED ORDER — ALBUTEROL SULFATE HFA 108 (90 BASE) MCG/ACT IN AERS: 2.0000 | INHALATION_SPRAY | Freq: Once | RESPIRATORY_TRACT | Status: DC | PRN

## 2019-03-17 MED ORDER — SODIUM CHLORIDE 0.9 % IV SOLN
INTRAVENOUS | Status: DC | PRN
  Administered 2019-03-17: 250 mL via INTRAVENOUS

## 2019-03-17 MED ORDER — EPINEPHRINE 0.3 MG/0.3ML IJ SOAJ: 0.3000 mg | Freq: Once | INTRAMUSCULAR | Status: DC | PRN

## 2019-03-17 MED ORDER — FAMOTIDINE IN NACL 20-0.9 MG/50ML-% IV SOLN: 20.0000 mg | Freq: Once | INTRAVENOUS | Status: DC | PRN

## 2019-03-17 MED ORDER — SODIUM CHLORIDE 0.9 % IV SOLN
700.0000 mg | Freq: Once | INTRAVENOUS | Status: AC
  Administered 2019-03-17: 700 mg via INTRAVENOUS
  Filled 2019-03-17: qty 20

## 2019-03-17 MED ORDER — DIPHENHYDRAMINE HCL 50 MG/ML IJ SOLN: 50.0000 mg | Freq: Once | INTRAMUSCULAR | Status: DC | PRN

## 2019-03-17 MED ORDER — METHYLPREDNISOLONE SODIUM SUCC 125 MG IJ SOLR: 125.0000 mg | Freq: Once | INTRAMUSCULAR | Status: DC | PRN

## 2019-03-17 NOTE — Progress Notes (Signed)
  Diagnosis: COVID-19  Physician: Dr. Delford Field  Procedure: [facility] Infusion [facility] Med: bamlanivimab infusion - Provided patient with bamlanimivab fact sheet for patients, parents and caregivers prior to infusion.  Complications: No immediate complications noted.  Discharge: Discharged home   Nevin Bloodgood 03/17/2019

## 2019-03-17 NOTE — Discharge Instructions (Signed)

## 2019-03-30 NOTE — Telephone Encounter (Signed)
Note was not needed 

## 2019-04-04 ENCOUNTER — Other Ambulatory Visit: Payer: Self-pay | Admitting: Family Medicine

## 2019-04-04 DIAGNOSIS — I10 Essential (primary) hypertension: Secondary | ICD-10-CM

## 2019-05-14 ENCOUNTER — Other Ambulatory Visit: Payer: Self-pay

## 2019-05-15 ENCOUNTER — Ambulatory Visit (INDEPENDENT_AMBULATORY_CARE_PROVIDER_SITE_OTHER): Payer: BC Managed Care – PPO | Admitting: Family Medicine

## 2019-05-15 ENCOUNTER — Other Ambulatory Visit: Payer: BC Managed Care – PPO

## 2019-05-15 ENCOUNTER — Encounter: Payer: Self-pay | Admitting: Family Medicine

## 2019-05-15 VITALS — BP 140/88 | HR 88 | Temp 98.0°F | Ht 61.0 in | Wt 187.1 lb

## 2019-05-15 DIAGNOSIS — I10 Essential (primary) hypertension: Secondary | ICD-10-CM

## 2019-05-15 DIAGNOSIS — E039 Hypothyroidism, unspecified: Secondary | ICD-10-CM | POA: Diagnosis not present

## 2019-05-15 DIAGNOSIS — R5383 Other fatigue: Secondary | ICD-10-CM

## 2019-05-15 LAB — CBC WITH DIFFERENTIAL/PLATELET
Basophils Absolute: 0 10*3/uL (ref 0.0–0.1)
Basophils Relative: 0.4 % (ref 0.0–3.0)
Eosinophils Absolute: 0.2 10*3/uL (ref 0.0–0.7)
Eosinophils Relative: 2.3 % (ref 0.0–5.0)
HCT: 40.8 % (ref 36.0–46.0)
Hemoglobin: 13.8 g/dL (ref 12.0–15.0)
Lymphocytes Relative: 33.9 % (ref 12.0–46.0)
Lymphs Abs: 3 10*3/uL (ref 0.7–4.0)
MCHC: 33.7 g/dL (ref 30.0–36.0)
MCV: 86.4 fl (ref 78.0–100.0)
Monocytes Absolute: 0.6 10*3/uL (ref 0.1–1.0)
Monocytes Relative: 6.4 % (ref 3.0–12.0)
Neutro Abs: 5.1 10*3/uL (ref 1.4–7.7)
Neutrophils Relative %: 57 % (ref 43.0–77.0)
Platelets: 265 10*3/uL (ref 150.0–400.0)
RBC: 4.72 Mil/uL (ref 3.87–5.11)
RDW: 15.5 % (ref 11.5–15.5)
WBC: 9 10*3/uL (ref 4.0–10.5)

## 2019-05-15 LAB — COMPREHENSIVE METABOLIC PANEL
ALT: 20 U/L (ref 0–35)
AST: 22 U/L (ref 0–37)
Albumin: 4.5 g/dL (ref 3.5–5.2)
Alkaline Phosphatase: 75 U/L (ref 39–117)
BUN: 22 mg/dL (ref 6–23)
CO2: 29 mEq/L (ref 19–32)
Calcium: 9.7 mg/dL (ref 8.4–10.5)
Chloride: 102 mEq/L (ref 96–112)
Creatinine, Ser: 0.88 mg/dL (ref 0.40–1.20)
GFR: 78.22 mL/min (ref >60.00)
Glucose, Bld: 82 mg/dL (ref 70–99)
Potassium: 3.3 mEq/L — ABNORMAL LOW (ref 3.5–5.1)
Sodium: 140 mEq/L (ref 135–145)
Total Bilirubin: 0.4 mg/dL (ref 0.2–1.2)
Total Protein: 7.1 g/dL (ref 6.0–8.3)

## 2019-05-15 LAB — TSH: TSH: 0.12 u[IU]/mL — ABNORMAL LOW (ref 0.35–4.50)

## 2019-05-15 MED ORDER — TRIAMTERENE-HCTZ 37.5-25 MG PO TABS: 1.0000 | ORAL_TABLET | Freq: Every day | ORAL | 1 refills | Status: DC

## 2019-05-15 MED ORDER — AMLODIPINE BESYLATE 5 MG PO TABS: 5.0000 mg | ORAL_TABLET | Freq: Every day | ORAL | 1 refills | Status: DC

## 2019-05-15 NOTE — Progress Notes (Signed)
Nazli Penn DOB: 19-Oct-1955 Encounter date: 05/15/2019  This is a 64 y.o. female who presents with Chief Complaint  Patient presents with  . Follow-up    History of present illness: She hasn't been to bed yet. Thinks this is why bp elevated.   Knee is still healing - still some swelling in right knee. Feels better.   Next month will be able to get COVID vaccination. She had infection in Feb and received Ab. Lately has been having tingle in left arm. From left shoulder going down in arm. Has been there for about a week off an on. Comes and goes. No pain in shoulder, neck, hand. Not sure if related to way she sleeps.   Skin really dry. Has noted for a couple of months.   Still working on quitting smoking.   Energy not what it used to be. Tries to eat well; balanced and regular meals.   No Known Allergies Current Meds  Medication Sig  . amLODipine (NORVASC) 5 MG tablet Take 1 tablet (5 mg total) by mouth daily.  . Omega-3 Fatty Acids (FISH OIL PO) Take by mouth.  . SYNTHROID 175 MCG tablet Take 1 tablet (175 mcg total) by mouth daily before breakfast.  . triamterene-hydrochlorothiazide (MAXZIDE-25) 37.5-25 MG tablet Take 1 tablet by mouth daily.  . [DISCONTINUED] amLODipine (NORVASC) 5 MG tablet Take 1 tablet (5 mg total) by mouth daily.  . [DISCONTINUED] triamterene-hydrochlorothiazide (MAXZIDE-25) 37.5-25 MG tablet Take 1 tablet by mouth daily.    Review of Systems  Constitutional: Negative for chills, fatigue and fever.  Respiratory: Negative for cough, chest tightness, shortness of breath and wheezing.   Cardiovascular: Negative for chest pain, palpitations and leg swelling.    Objective:  BP 140/88 (BP Location: Left Arm, Patient Position: Sitting, Cuff Size: Large)   Pulse 88   Temp 98 F (36.7 C) (Temporal)   Ht 5\' 1"  (1.549 m)   Wt 187 lb 1.6 oz (84.9 kg)   BMI 35.35 kg/m   Weight: 187 lb 1.6 oz (84.9 kg)   BP Readings from Last 3 Encounters:   05/15/19 140/88  03/17/19 122/73  03/12/19 119/76   Wt Readings from Last 3 Encounters:  05/15/19 187 lb 1.6 oz (84.9 kg)  12/20/18 185 lb (83.9 kg)  11/07/18 190 lb 4.8 oz (86.3 kg)    Physical Exam Constitutional:      General: She is not in acute distress.    Appearance: She is well-developed.  Cardiovascular:     Rate and Rhythm: Normal rate and regular rhythm.     Heart sounds: Normal heart sounds. No murmur. No friction rub.  Pulmonary:     Effort: Pulmonary effort is normal. No respiratory distress.     Breath sounds: Normal breath sounds. No wheezing or rales.  Musculoskeletal:     Right lower leg: No edema.     Left lower leg: No edema.  Neurological:     Mental Status: She is alert and oriented to person, place, and time.  Psychiatric:        Behavior: Behavior normal.     Assessment/Plan  1. Essential hypertension Restart amlodipine; monitor bp at home.  - amLODipine (NORVASC) 5 MG tablet; Take 1 tablet (5 mg total) by mouth daily.  Dispense: 90 tablet; Refill: 1 - triamterene-hydrochlorothiazide (MAXZIDE-25) 37.5-25 MG tablet; Take 1 tablet by mouth daily.  Dispense: 90 tablet; Refill: 1 - CBC with Differential/Platelet; Future - Comprehensive metabolic panel; Future  2. Fatigue, unspecified type  Will check bloodwork today; she is going to work on getting back to walking.   3. Hypothyroidism, unspecified type Taking synthroid regularly - TSH; Future   Return in about 3 months (around 08/14/2019) for Chronic condition visit.    Theodis Shove, MD

## 2019-05-15 NOTE — Patient Instructions (Signed)
COVID-19 Vaccine Information can be found at: [email or web address] For questions related to vaccine distribution or appointments, please email [email or web address] or call [phone].    

## 2019-05-15 NOTE — Addendum Note (Signed)
Addended by: Bonnye Fava on: 05/15/2019 01:51 PM   Modules accepted: Orders

## 2019-05-18 ENCOUNTER — Other Ambulatory Visit: Payer: Self-pay | Admitting: Family Medicine

## 2019-05-18 DIAGNOSIS — E876 Hypokalemia: Secondary | ICD-10-CM

## 2019-05-18 DIAGNOSIS — E039 Hypothyroidism, unspecified: Secondary | ICD-10-CM

## 2019-05-18 MED ORDER — SYNTHROID 150 MCG PO TABS: 150.0000 ug | ORAL_TABLET | Freq: Every day | ORAL | 1 refills | Status: DC

## 2019-05-18 MED ORDER — POTASSIUM CHLORIDE CRYS ER 10 MEQ PO TBCR: 10.0000 meq | EXTENDED_RELEASE_TABLET | Freq: Every day | ORAL | 1 refills | Status: DC

## 2019-08-21 ENCOUNTER — Encounter: Payer: Self-pay | Admitting: Family Medicine

## 2019-08-21 ENCOUNTER — Other Ambulatory Visit: Payer: Self-pay

## 2019-08-21 ENCOUNTER — Ambulatory Visit (INDEPENDENT_AMBULATORY_CARE_PROVIDER_SITE_OTHER): Payer: BC Managed Care – PPO | Admitting: Family Medicine

## 2019-08-21 VITALS — BP 120/82 | HR 81 | Temp 98.1°F | Ht 61.0 in | Wt 184.2 lb

## 2019-08-21 DIAGNOSIS — E876 Hypokalemia: Secondary | ICD-10-CM

## 2019-08-21 DIAGNOSIS — E039 Hypothyroidism, unspecified: Secondary | ICD-10-CM | POA: Diagnosis not present

## 2019-08-21 DIAGNOSIS — I10 Essential (primary) hypertension: Secondary | ICD-10-CM

## 2019-08-21 DIAGNOSIS — Z1159 Encounter for screening for other viral diseases: Secondary | ICD-10-CM | POA: Diagnosis not present

## 2019-08-21 DIAGNOSIS — Z13220 Encounter for screening for lipoid disorders: Secondary | ICD-10-CM

## 2019-08-21 MED ORDER — TRIAMTERENE-HCTZ 37.5-25 MG PO TABS: 1.0000 | ORAL_TABLET | Freq: Every day | ORAL | 1 refills | Status: DC

## 2019-08-21 MED ORDER — SYNTHROID 150 MCG PO TABS: 150.0000 ug | ORAL_TABLET | Freq: Every day | ORAL | 1 refills | Status: DC

## 2019-08-21 MED ORDER — AMLODIPINE BESYLATE 5 MG PO TABS: 5.0000 mg | ORAL_TABLET | Freq: Every day | ORAL | 1 refills | Status: DC

## 2019-08-21 MED ORDER — POTASSIUM CHLORIDE CRYS ER 10 MEQ PO TBCR: 10.0000 meq | EXTENDED_RELEASE_TABLET | Freq: Every day | ORAL | 1 refills | Status: DC

## 2019-08-21 NOTE — Progress Notes (Signed)
Anna Pratt DOB: [birth year] Encounter date: 08/21/2019  This is a 64 y.o. female who presents with Chief Complaint  Patient presents with  . Follow-up    History of present illness: Wanting to get weight off. Exercising, walking. Joined the [employer]. Just feels it is hard to lose weight.    Hypothyroid (still has a full bottle of the tablets that she didn't use; she has been taking the daily.   Htn: not taking at home; good about taking medication regularly.  Hard for her to eat 3 meals/day. Working third shift. Tries to fit in exercise on weekends when she is off. Would like to start aerobics to get knee moving. Knee swells with excessive activity. Doing much better overall.   States that she is doing well with smoking. Usually after she eats. Walks with 5 pound weights.   Energy level overall feels pretty good as long as she gets 6-6.5 hours sleep. Works third shift so not always easy for her.   No Known Allergies Current Meds  Medication Sig  . amLODipine (NORVASC) 5 MG tablet Take 1 tablet (5 mg total) by mouth daily.  . Omega-3 Fatty Acids (FISH OIL PO) Take by mouth.  . potassium chloride (KLOR-CON) 10 MEQ tablet Take 1 tablet (10 mEq total) by mouth daily.  Marland Kitchen SYNTHROID 150 MCG tablet Take 1 tablet (150 mcg total) by mouth daily before breakfast.  . triamterene-hydrochlorothiazide (MAXZIDE-25) 37.5-25 MG tablet Take 1 tablet by mouth daily.  . [DISCONTINUED] amLODipine (NORVASC) 5 MG tablet Take 1 tablet (5 mg total) by mouth daily.  . [DISCONTINUED] potassium chloride (KLOR-CON) 10 MEQ tablet Take 1 tablet (10 mEq total) by mouth daily.  . [DISCONTINUED] SYNTHROID 150 MCG tablet Take 1 tablet (150 mcg total) by mouth daily before breakfast.  . [DISCONTINUED] triamterene-hydrochlorothiazide (MAXZIDE-25) 37.5-25 MG tablet Take 1 tablet by mouth daily.    Review of Systems  Constitutional: Negative for chills, fatigue and fever.  Respiratory:  Negative for cough, chest tightness, shortness of breath and wheezing.   Cardiovascular: Negative for chest pain, palpitations and leg swelling.    Objective:  BP 120/82 (BP Location: Left Arm, Patient Position: Sitting, Cuff Size: Large)   Pulse 81   Temp 98.1 F (36.7 C) (Oral)   Ht 5\' 1"  (1.549 m)   Wt 184 lb 3.2 oz (83.6 kg)   BMI 34.80 kg/m   Weight: 184 lb 3.2 oz (83.6 kg)   BP Readings from Last 3 Encounters:  08/21/19 120/82  05/15/19 140/88  03/17/19 122/73   Wt Readings from Last 3 Encounters:  08/21/19 184 lb 3.2 oz (83.6 kg)  05/15/19 187 lb 1.6 oz (84.9 kg)  12/20/18 185 lb (83.9 kg)    Physical Exam Constitutional:      General: She is not in acute distress.    Appearance: She is well-developed.  Cardiovascular:     Rate and Rhythm: Normal rate and regular rhythm.     Heart sounds: Normal heart sounds. No murmur heard.  No friction rub.  Pulmonary:     Effort: Pulmonary effort is normal. No respiratory distress.     Breath sounds: Normal breath sounds. No wheezing or rales.  Musculoskeletal:     Right lower leg: No edema.     Left lower leg: No edema.  Neurological:     Mental Status: She is alert and oriented to person, place, and time.  Psychiatric:        Behavior: Behavior normal.  Assessment/Plan  1. Hypokalemia - potassium chloride (KLOR-CON) 10 MEQ tablet; Take 1 tablet (10 mEq total) by mouth daily.  Dispense: 90 tablet; Refill: 1  2. Essential hypertension Blood pressure well controlled.  Continue current medication. - amLODipine (NORVASC) 5 MG tablet; Take 1 tablet (5 mg total) by mouth daily.  Dispense: 90 tablet; Refill: 1 - triamterene-hydrochlorothiazide (MAXZIDE-25) 37.5-25 MG tablet; Take 1 tablet by mouth daily.  Dispense: 90 tablet; Refill: 1 - Comprehensive metabolic panel; Future - Comprehensive metabolic panel  3. Hypothyroidism, unspecified type We will recheck blood work. - SYNTHROID 150 MCG tablet; Take 1 tablet  (150 mcg total) by mouth daily before breakfast.  Dispense: 90 tablet; Refill: 1 - TSH; Future - TSH  4. Lipid screening - Lipid panel; Future - Lipid panel  5. Encounter for hepatitis C screening test for low risk patient - Hepatitis C antibody; Future - Hepatitis C antibody  Return in about 6 months (around 02/21/2020) for physical exam.      Theodis Shove, MD

## 2019-08-21 NOTE — Patient Instructions (Signed)
For weight loss try around 1200-1500cal/day. You don't get to add back in calories for those that you burned with exercise or activity.   Work on adding in exercise every day.

## 2019-08-24 LAB — COMPREHENSIVE METABOLIC PANEL
AG Ratio: 1.5 (calc) (ref 1.0–2.5)
ALT: 17 U/L (ref 6–29)
AST: 19 U/L (ref 10–35)
Albumin: 4.4 g/dL (ref 3.6–5.1)
Alkaline phosphatase (APISO): 79 U/L (ref 37–153)
BUN: 22 mg/dL (ref 7–25)
CO2: 26 mmol/L (ref 20–32)
Calcium: 9.6 mg/dL (ref 8.6–10.4)
Chloride: 104 mmol/L (ref 98–110)
Creat: 0.81 mg/dL (ref 0.50–0.99)
Globulin: 2.9 g/dL (calc) (ref 1.9–3.7)
Glucose, Bld: 75 mg/dL (ref 65–99)
Potassium: 3.7 mmol/L (ref 3.5–5.3)
Sodium: 140 mmol/L (ref 135–146)
Total Bilirubin: 0.3 mg/dL (ref 0.2–1.2)
Total Protein: 7.3 g/dL (ref 6.1–8.1)

## 2019-08-24 LAB — HEPATITIS C ANTIBODY
Hepatitis C Ab: NONREACTIVE
SIGNAL TO CUT-OFF: 0.01 (ref <1.00)

## 2019-08-24 LAB — LIPID PANEL
Cholesterol: 186 mg/dL (ref <200)
HDL: 64 mg/dL (ref >=50)
LDL Cholesterol (Calc): 101 mg/dL (calc) — ABNORMAL HIGH
Non-HDL Cholesterol (Calc): 122 mg/dL (calc) (ref <130)
Total CHOL/HDL Ratio: 2.9 (calc) (ref <5.0)
Triglycerides: 113 mg/dL (ref <150)

## 2019-08-24 LAB — TSH: TSH: 0.16 mIU/L — ABNORMAL LOW (ref 0.40–4.50)

## 2019-08-25 NOTE — Addendum Note (Signed)
Addended by: Sallee Lange A on: 08/25/2019 10:00 AM   Modules accepted: Orders

## 2019-10-30 ENCOUNTER — Other Ambulatory Visit: Payer: Self-pay

## 2019-10-30 ENCOUNTER — Other Ambulatory Visit (INDEPENDENT_AMBULATORY_CARE_PROVIDER_SITE_OTHER): Payer: BC Managed Care – PPO

## 2019-10-30 DIAGNOSIS — E876 Hypokalemia: Secondary | ICD-10-CM

## 2019-10-30 DIAGNOSIS — E039 Hypothyroidism, unspecified: Secondary | ICD-10-CM

## 2019-10-30 NOTE — Addendum Note (Signed)
Addended by: Lerry Liner on: 10/30/2019 08:34 AM   Modules accepted: Orders

## 2019-10-31 LAB — BASIC METABOLIC PANEL
BUN: 24 mg/dL (ref 7–25)
CO2: 25 mmol/L (ref 20–32)
Calcium: 9.3 mg/dL (ref 8.6–10.4)
Chloride: 106 mmol/L (ref 98–110)
Creat: 0.85 mg/dL (ref 0.50–0.99)
Glucose, Bld: 85 mg/dL (ref 65–99)
Potassium: 3.7 mmol/L (ref 3.5–5.3)
Sodium: 141 mmol/L (ref 135–146)

## 2019-10-31 LAB — SPECIMEN COMPROMISED

## 2019-10-31 LAB — TSH: TSH: 2.6 mIU/L (ref 0.40–4.50)

## 2019-11-04 ENCOUNTER — Telehealth: Payer: Self-pay | Admitting: Family Medicine

## 2019-11-04 NOTE — Telephone Encounter (Signed)
Pt call and want a call back to talk about her labs/mm

## 2019-11-05 NOTE — Telephone Encounter (Signed)
Spoke with the pt and she stated she was concerned with one of the labs with a note attached stating the specimen was compromised.  I spoke with the pt and informed her the labs were reviewed by the PCP.

## 2020-01-05 ENCOUNTER — Other Ambulatory Visit: Payer: Self-pay | Admitting: Family Medicine

## 2020-02-25 ENCOUNTER — Other Ambulatory Visit: Payer: Self-pay

## 2020-02-26 ENCOUNTER — Encounter: Payer: Self-pay | Admitting: Family Medicine

## 2020-02-26 ENCOUNTER — Ambulatory Visit (INDEPENDENT_AMBULATORY_CARE_PROVIDER_SITE_OTHER): Payer: BC Managed Care – PPO | Admitting: Family Medicine

## 2020-02-26 ENCOUNTER — Other Ambulatory Visit (HOSPITAL_COMMUNITY)
Admission: RE | Admit: 2020-02-26 | Discharge: 2020-02-26 | Disposition: A | Discharge status: Home / Self Care | Payer: BC Managed Care – PPO | Source: Ambulatory Visit | Attending: Family Medicine | Admitting: Family Medicine

## 2020-02-26 VITALS — BP 118/70 | HR 80 | Temp 97.8°F | Ht 61.5 in | Wt 190.1 lb

## 2020-02-26 DIAGNOSIS — Z0000 Encounter for general adult medical examination without abnormal findings: Secondary | ICD-10-CM | POA: Diagnosis not present

## 2020-02-26 DIAGNOSIS — E039 Hypothyroidism, unspecified: Secondary | ICD-10-CM

## 2020-02-26 DIAGNOSIS — E2839 Other primary ovarian failure: Secondary | ICD-10-CM

## 2020-02-26 DIAGNOSIS — Z124 Encounter for screening for malignant neoplasm of cervix: Secondary | ICD-10-CM

## 2020-02-26 DIAGNOSIS — H9313 Tinnitus, bilateral: Secondary | ICD-10-CM | POA: Diagnosis not present

## 2020-02-26 DIAGNOSIS — Z1231 Encounter for screening mammogram for malignant neoplasm of breast: Secondary | ICD-10-CM

## 2020-02-26 DIAGNOSIS — I10 Essential (primary) hypertension: Secondary | ICD-10-CM | POA: Diagnosis not present

## 2020-02-26 NOTE — Patient Instructions (Signed)
Pneumococcal Vaccine, Polyvalent solution for injection What is this medicine? PNEUMOCOCCAL VACCINE, POLYVALENT (NEU mo KOK al vak SEEN, pol ee VEY luhnt) is a vaccine to prevent pneumococcus bacteria infection. These bacteria are a major cause of ear infections, Strep throat infections, and serious pneumonia, meningitis, or blood infections worldwide. These vaccines help the body to produce antibodies (protective substances) that help your body defend against these bacteria. This vaccine is recommended for people 2 years of age and older with health problems. It is also recommended for all adults over 50 years old. This vaccine will not treat an infection. This medicine may be used for other purposes; ask your health care provider or pharmacist if you have questions. COMMON BRAND NAME(S): Pneumovax 23 What should I tell my health care provider before I take this medicine? They need to know if you have any of these conditions:  bleeding problems  bone marrow or organ transplant  cancer, Hodgkin's disease  fever  infection  immune system problems  low platelet count in the blood  seizures  an unusual or allergic reaction to pneumococcal vaccine, diphtheria toxoid, other vaccines, latex, other medicines, foods, dyes, or preservatives  pregnant or trying to get pregnant  breast-feeding How should I use this medicine? This vaccine is for injection into a muscle or under the skin. It is given by a health care professional. A copy of Vaccine Information Statements will be given before each vaccination. Read this sheet carefully each time. The sheet may change frequently. Talk to your pediatrician regarding the use of this medicine in children. While this drug may be prescribed for children as young as 2 years of age for selected conditions, precautions do apply. Overdosage: If you think you have taken too much of this medicine contact a poison control center or emergency room at  once. NOTE: This medicine is only for you. Do not share this medicine with others. What if I miss a dose? It is important not to miss your dose. Call your doctor or health care professional if you are unable to keep an appointment. What may interact with this medicine?  medicines for cancer chemotherapy  medicines that suppress your immune function  medicines that treat or prevent blood clots like warfarin, enoxaparin, and dalteparin  steroid medicines like prednisone or cortisone This list may not describe all possible interactions. Give your health care provider a list of all the medicines, herbs, non-prescription drugs, or dietary supplements you use. Also tell them if you smoke, drink alcohol, or use illegal drugs. Some items may interact with your medicine. What should I watch for while using this medicine? Mild fever and pain should go away in 3 days or less. Report any unusual symptoms to your doctor or health care professional. What side effects may I notice from receiving this medicine? Side effects that you should report to your doctor or health care professional as soon as possible:  allergic reactions like skin rash, itching or hives, swelling of the face, lips, or tongue  breathing problems  confused  fever over 102 degrees F  pain, tingling, numbness in the hands or feet  seizures  unusual bleeding or bruising  unusual muscle weakness Side effects that usually do not require medical attention (report to your doctor or health care professional if they continue or are bothersome):  aches and pains  diarrhea  fever of 102 degrees F or less  headache  irritable  loss of appetite  pain, tender at site where injected    trouble sleeping This list may not describe all possible side effects. Call your doctor for medical advice about side effects. You may report side effects to FDA at 1-800-FDA-1088. Where should I keep my medicine? This does not apply. This  vaccine is given in a clinic, pharmacy, doctor's office, or other health care setting and will not be stored at home. NOTE: This sheet is a summary. It may not cover all possible information. If you have questions about this medicine, talk to your doctor, pharmacist, or health care provider.  2021 Elsevier/Gold Standard (2007-08-22 14:32:37)  

## 2020-02-26 NOTE — Progress Notes (Signed)
Anna Pratt DOB: March 24, 1955 Encounter date: 02/26/2020  This is a 65 y.o. female who presents for complete physical   History of present illness/Additional concerns: HTN: amlodipine 11m, maxzide 37.5-25mg daily Hypothyroid: synthroid 1563m daily - skin always dry, peeling. Just works to keep it moisturized.   Buzzing in ears is bothering her. some days she doesn't note it as much. No pain in ears. Does have some hearing loss; related to working [place].   Hasn't had a pap smear in years.   Past Medical History:  Diagnosis Date  . Hypertension   . Osteoarthritis, knee   . Thyroid goiter    Past Surgical History:  Procedure Laterality Date  . ANKLE ARTHROPLASTY     done after injury  . CESAREAN SECTION    . KIDNEY STONE SURGERY    . THYROID SURGERY     due to goiter 32 years ago   No Known Allergies Current Meds  Medication Sig  . amLODipine (NORVASC) 5 MG tablet Take 1 tablet (5 mg total) by mouth daily.  . Omega-3 Fatty Acids (FISH OIL PO) Take by mouth.  . potassium chloride (KLOR-CON) 10 MEQ tablet Take 1 tablet (10 mEq total) by mouth daily.  . Marland KitchenYNTHROID 150 MCG tablet Take 1 tablet (150 mcg total) by mouth daily before breakfast.  . triamterene-hydrochlorothiazide (MAXZIDE-25) 37.5-25 MG tablet Take 1 tablet by mouth daily.   Social History   Tobacco Use  . Smoking status: Former Smoker    Packs/day: 0.50    Years: 42.00    Pack years: 21.00    Types: Cigarettes    Quit date: 06/21/2019    Years since quitting: 0.6  . Smokeless tobacco: Never Used  Substance Use Topics  . Alcohol use: Yes    Comment: socially   Family History  Problem Relation Age of Onset  . Stroke Father   . Heart attack Father   . Diabetes Maternal Grandmother   . Diabetes Mellitus II Maternal Aunt   . Bone cancer Sister [id number]. Healthy Daughter      Review of Systems  Constitutional: Negative for activity change, appetite change, chills, fatigue, fever and  unexpected weight change.  HENT: Positive for tinnitus. Negative for congestion, ear pain, hearing loss, sinus pressure, sinus pain, sore throat and trouble swallowing.   Eyes: Negative for pain and visual disturbance.  Respiratory: Negative for cough, chest tightness, shortness of breath and wheezing.   Cardiovascular: Negative for chest pain, palpitations and leg swelling.  Gastrointestinal: Negative for abdominal pain, blood in stool, constipation, diarrhea, nausea and vomiting.  Genitourinary: Negative for difficulty urinating and menstrual problem.  Musculoskeletal: Negative for arthralgias and back pain.  Skin: Negative for rash.       Dry  Neurological: Negative for dizziness, weakness, numbness and headaches.  Hematological: Negative for adenopathy. Does not bruise/bleed easily.  Psychiatric/Behavioral: Negative for sleep disturbance and suicidal ideas. The patient is not nervous/anxious.     CBC:  Lab Results  Component Value Date   WBC 11.5 (H) 02/26/2020   HGB 14.4 02/26/2020   HCT 42.5 02/26/2020   MCH 29.4 02/26/2020   MCHC 33.9 02/26/2020   RDW 13.8 02/26/2020   PLT 261 02/26/2020   MPV 10.8 02/26/2020   CMP: Lab Results  Component Value Date   NA 140 02/26/2020   K 3.9 02/26/2020   CL 104 02/26/2020   CO2 26 02/26/2020   GLUCOSE 82 02/26/2020   BUN 23 02/26/2020   CREATININE  0.80 02/26/2020   GFRAA 90 02/26/2020   CALCIUM 9.9 02/26/2020   PROT 7.2 02/26/2020   BILITOT 0.5 02/26/2020   ALKPHOS 75 05/15/2019   ALT 25 02/26/2020   AST 24 02/26/2020   LIPID: Lab Results  Component Value Date   CHOL 186 08/21/2019   TRIG 113 08/21/2019   HDL 64 08/21/2019   LDLCALC 101 (H) 08/21/2019    Objective:  BP 118/70 (BP Location: Left Arm, Patient Position: Sitting, Cuff Size: Large)   Pulse 80   Temp 97.8 F (36.6 C) (Oral)   Ht 5' 1.5" (1.562 m)   Wt 190 lb 1.6 oz (86.2 kg)   SpO2 96%   BMI 35.34 kg/m   Weight: 190 lb 1.6 oz (86.2 kg)   BP  Readings from Last 3 Encounters:  02/26/20 118/70  08/21/19 120/82  05/15/19 140/88   Wt Readings from Last 3 Encounters:  02/26/20 190 lb 1.6 oz (86.2 kg)  08/21/19 184 lb 3.2 oz (83.6 kg)  05/15/19 187 lb 1.6 oz (84.9 kg)    Physical Exam Exam conducted with a chaperone present.  Constitutional:      General: She is not in acute distress.    Appearance: She is well-developed and well-nourished.  HENT:     Head: Normocephalic and atraumatic.     Right Ear: External ear normal.     Left Ear: External ear normal.     Mouth/Throat:     Mouth: Oropharynx is clear and moist.     Pharynx: No oropharyngeal exudate.  Eyes:     Conjunctiva/sclera: Conjunctivae normal.     Pupils: Pupils are equal, round, and reactive to light.  Neck:     Thyroid: No thyromegaly.  Cardiovascular:     Rate and Rhythm: Normal rate and regular rhythm.     Heart sounds: Normal heart sounds. No murmur heard. No friction rub. No gallop.   Pulmonary:     Effort: Pulmonary effort is normal.     Breath sounds: Normal breath sounds.  Abdominal:     General: Bowel sounds are normal. There is no distension.     Palpations: Abdomen is soft. There is no mass.     Tenderness: There is no abdominal tenderness. There is no guarding.     Hernia: No hernia is present.  Genitourinary:    Vagina: Normal.     Cervix: Normal.     Uterus: Normal.   Musculoskeletal:        General: No tenderness, deformity or edema. Normal range of motion.     Cervical back: Normal range of motion and neck supple.  Lymphadenopathy:     Cervical: No cervical adenopathy.  Skin:    General: Skin is warm and dry.     Findings: No rash.  Neurological:     Mental Status: She is alert and oriented to person, place, and time.     Deep Tendon Reflexes: Strength normal. Reflexes normal.     Reflex Scores:      Tricep reflexes are 2+ on the right side and 2+ on the left side.      Bicep reflexes are 2+ on the right side and 2+ on the  left side.      Brachioradialis reflexes are 2+ on the right side and 2+ on the left side.      Patellar reflexes are 2+ on the right side and 2+ on the left side. Psychiatric:        Mood and  Affect: Mood and affect normal.        Speech: Speech normal.        Behavior: Behavior normal.        Thought Content: Thought content normal.     Assessment/Plan: Health Maintenance Due  Topic Date Due  . DEXA SCAN  Never done  . PNA vac Low Risk Adult (1 of 2 - PCV13) 02/18/2020   Health Maintenance reviewed - dexa, mammogram ordered.  1. Preventative health care She declined pneumonia vaccination; I did talk with her about this vaccination and gave her handout to read about it. - CBC with Differential/Platelet; Future - CMP with eGFR([facility]); Future - TSH; Future - TSH - CMP with eGFR([facility]) - CBC with Differential/Platelet  2. Primary hypertension Blood pressures been well controlled.  Continue amlodipine 5 mg and Maxide 37.5-25 mg daily. - CBC with Differential/Platelet; Future - CMP with eGFR([facility]); Future - TSH; Future - TSH - CMP with eGFR([facility]) - CBC with Differential/Platelet  3. Hypothyroidism, unspecified type Thyroid has been stable.  Continue Synthroid 150 mcg daily.  Check blood work today. - CBC with Differential/Platelet; Future - CMP with eGFR([facility]); Future - TSH; Future - TSH - CMP with eGFR([facility]) - CBC with Differential/Platelet  4. Tinnitus of both ears - Ambulatory referral to ENT; Future  5. Encounter for screening mammogram for malignant neoplasm of breast - MM Digital Screening; Future  6. Estrogen deficiency - DG Bone Density; Future  7. Cervical cancer screening - PAP [[facility]]  Return in about 6 months (around 08/25/2020) for Chronic condition visit.  Micheline Rough, MD

## 2020-02-27 LAB — COMPLETE METABOLIC PANEL WITH GFR
AG Ratio: 1.7 (calc) (ref 1.0–2.5)
ALT: 25 U/L (ref 6–29)
AST: 24 U/L (ref 10–35)
Albumin: 4.5 g/dL (ref 3.6–5.1)
Alkaline phosphatase (APISO): 74 U/L (ref 37–153)
BUN: 23 mg/dL (ref 7–25)
CO2: 26 mmol/L (ref 20–32)
Calcium: 9.9 mg/dL (ref 8.6–10.4)
Chloride: 104 mmol/L (ref 98–110)
Creat: 0.8 mg/dL (ref 0.50–0.99)
GFR, Est African American: 90 mL/min/{1.73_m2} (ref >=60)
GFR, Est Non African American: 77 mL/min/{1.73_m2} (ref >=60)
Globulin: 2.7 g/dL (calc) (ref 1.9–3.7)
Glucose, Bld: 82 mg/dL (ref 65–99)
Potassium: 3.9 mmol/L (ref 3.5–5.3)
Sodium: 140 mmol/L (ref 135–146)
Total Bilirubin: 0.5 mg/dL (ref 0.2–1.2)
Total Protein: 7.2 g/dL (ref 6.1–8.1)

## 2020-02-27 LAB — CBC WITH DIFFERENTIAL/PLATELET
Absolute Monocytes: 771 cells/uL (ref 200–950)
Basophils Absolute: 23 cells/uL (ref 0–200)
Basophils Relative: 0.2 %
Eosinophils Absolute: 253 cells/uL (ref 15–500)
Eosinophils Relative: 2.2 %
HCT: 42.5 % (ref 35.0–45.0)
Hemoglobin: 14.4 g/dL (ref 11.7–15.5)
Lymphs Abs: 3761 cells/uL (ref 850–3900)
MCH: 29.4 pg (ref 27.0–33.0)
MCHC: 33.9 g/dL (ref 32.0–36.0)
MCV: 86.9 fL (ref 80.0–100.0)
MPV: 10.8 fL (ref 7.5–12.5)
Monocytes Relative: 6.7 %
Neutro Abs: 6693 cells/uL (ref 1500–7800)
Neutrophils Relative %: 58.2 %
Platelets: 261 10*3/uL (ref 140–400)
RBC: 4.89 10*6/uL (ref 3.80–5.10)
RDW: 13.8 % (ref 11.0–15.0)
Total Lymphocyte: 32.7 %
WBC: 11.5 10*3/uL — ABNORMAL HIGH (ref 3.8–10.8)

## 2020-02-27 LAB — TSH: TSH: 5.11 mIU/L — ABNORMAL HIGH (ref 0.40–4.50)

## 2020-03-02 ENCOUNTER — Other Ambulatory Visit: Payer: Self-pay | Admitting: Family Medicine

## 2020-03-03 LAB — CYTOLOGY - PAP
Comment: NEGATIVE
Diagnosis: UNDETERMINED — AB
High risk HPV: NEGATIVE

## 2020-03-11 ENCOUNTER — Other Ambulatory Visit: Payer: Self-pay

## 2020-03-11 ENCOUNTER — Ambulatory Visit (INDEPENDENT_AMBULATORY_CARE_PROVIDER_SITE_OTHER): Payer: BC Managed Care – PPO | Admitting: *Deleted

## 2020-03-11 DIAGNOSIS — Z23 Encounter for immunization: Secondary | ICD-10-CM | POA: Diagnosis not present

## 2020-03-23 ENCOUNTER — Ambulatory Visit (INDEPENDENT_AMBULATORY_CARE_PROVIDER_SITE_OTHER): Payer: BC Managed Care – PPO | Admitting: Otolaryngology

## 2020-03-23 ENCOUNTER — Other Ambulatory Visit: Payer: Self-pay

## 2020-03-23 VITALS — Temp 96.1°F

## 2020-03-23 DIAGNOSIS — H9313 Tinnitus, bilateral: Secondary | ICD-10-CM | POA: Diagnosis not present

## 2020-03-23 NOTE — Progress Notes (Signed)
HPI: Anna Pratt is a 65 y.o. female who presents is referred by her PCP for evaluation of chronic tinnitus that she has had for a number of years.  It has gradually gotten worse.Marland Kitchen  Past Medical History:  Diagnosis Date  . Hypertension   . Osteoarthritis, knee   . Thyroid goiter    Past Surgical History:  Procedure Laterality Date  . ANKLE ARTHROPLASTY     done after injury  . CESAREAN SECTION    . KIDNEY STONE SURGERY    . THYROID SURGERY     due to goiter 32 years ago   Social History   Socioeconomic History  . Marital status: Single    Spouse name: Not on file  . Number of children: Not on file  . Years of education: Not on file  . Highest education level: Not on file  Occupational History  . Not on file  Tobacco Use  . Smoking status: Former Smoker    Packs/day: 0.50    Years: 42.00    Pack years: 21.00    Types: Cigarettes    Quit date: 06/21/2019    Years since quitting: 0.7  . Smokeless tobacco: Never Used  Substance and Sexual Activity  . Alcohol use: Yes    Comment: socially  . Drug use: Never  . Sexual activity: Not Currently  Other Topics Concern  . Not on file  Social History Narrative  . Not on file   Social Determinants of Health   Financial Resource Strain: Not on file  Food Insecurity: Not on file  Transportation Needs: Not on file  Physical Activity: Not on file  Stress: Not on file  Social Connections: Not on file   Family History  Problem Relation Age of Onset  . Stroke Father   . Heart attack Father   . Diabetes Maternal Grandmother   . Diabetes Mellitus II Maternal Aunt   . Bone cancer Sister 29  . Healthy Daughter    No Known Allergies Prior to Admission medications   Medication Sig Start Date End Date Taking? Authorizing Provider  amLODipine (NORVASC) 5 MG tablet Take 1 tablet (5 mg total) by mouth daily. 08/21/19   Wynn Banker, MD  Omega-3 Fatty Acids (FISH OIL PO) Take by mouth.    [provider]  potassium chloride (KLOR-CON) 10 MEQ tablet Take 1 tablet (10 mEq total) by mouth daily. 08/21/19   Wynn Banker, MD  SYNTHROID 150 MCG tablet Take 1 tablet (150 mcg total) by mouth daily before breakfast. 08/21/19   Koberlein, Paris Lore, MD  triamterene-hydrochlorothiazide (MAXZIDE-25) 37.5-25 MG tablet Take 1 tablet by mouth daily. 08/21/19   Wynn Banker, MD     Positive ROS: Otherwise negative  All other systems have been reviewed and were otherwise negative with the exception of those mentioned in the HPI and as above.  Physical Exam: Constitutional: Alert, well-appearing, no acute distress Ears: External ears without lesions or tenderness. Ear canals are clear bilaterally with intact, clear TMs bilaterally. Nasal: External nose without lesions. Septum midline.. Clear nasal passages Oral: Lips and gums without lesions. Tongue and palate mucosa without lesions. Posterior oropharynx clear. Neck: No palpable adenopathy or masses Respiratory: Breathing comfortably  Skin: No facial/neck lesions or rash noted.  Procedures  Audiogram demonstrated normal hearing in the low and mid frequencies up to 4000 frequency.  With slight decreased hearing in both ears at the 6000 frequency and above.  SRT's were 10 DB on the  right and 15 DB on the left.  She had type A tympanograms bilaterally.  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  Plan: Discussed with her concerning using masking noise to help with the tinnitus when she is in a quiet environment.  Also gave her samples of Lipo flavonoid to try which is beneficial in some people with tinnitus. Reassured her of normal hearing evaluation otherwise.   Narda Bonds, MD   CC:

## 2020-04-01 ENCOUNTER — Encounter (INDEPENDENT_AMBULATORY_CARE_PROVIDER_SITE_OTHER): Payer: Self-pay

## 2020-04-08 ENCOUNTER — Ambulatory Visit
Admission: RE | Admit: 2020-04-08 | Discharge: 2020-04-08 | Disposition: A | Discharge status: Home / Self Care | Payer: BC Managed Care – PPO | Source: Ambulatory Visit | Attending: Family Medicine | Admitting: Family Medicine

## 2020-04-08 ENCOUNTER — Other Ambulatory Visit: Payer: Self-pay

## 2020-04-08 DIAGNOSIS — E2839 Other primary ovarian failure: Secondary | ICD-10-CM

## 2020-04-08 DIAGNOSIS — Z1231 Encounter for screening mammogram for malignant neoplasm of breast: Secondary | ICD-10-CM

## 2020-04-15 ENCOUNTER — Other Ambulatory Visit: Payer: BC Managed Care – PPO

## 2020-05-06 ENCOUNTER — Other Ambulatory Visit (INDEPENDENT_AMBULATORY_CARE_PROVIDER_SITE_OTHER): Payer: BC Managed Care – PPO

## 2020-05-06 ENCOUNTER — Other Ambulatory Visit: Payer: Self-pay

## 2020-05-06 DIAGNOSIS — E039 Hypothyroidism, unspecified: Secondary | ICD-10-CM | POA: Diagnosis not present

## 2020-05-06 LAB — TSH: TSH: 0.69 u[IU]/mL (ref 0.35–4.50)

## 2020-07-03 ENCOUNTER — Other Ambulatory Visit: Payer: Self-pay | Admitting: Family Medicine

## 2020-07-03 DIAGNOSIS — E876 Hypokalemia: Secondary | ICD-10-CM

## 2020-07-04 ENCOUNTER — Other Ambulatory Visit: Payer: Self-pay | Admitting: Family Medicine

## 2020-07-04 DIAGNOSIS — I10 Essential (primary) hypertension: Secondary | ICD-10-CM

## 2020-07-11 ENCOUNTER — Other Ambulatory Visit: Payer: Self-pay | Admitting: Family Medicine

## 2020-07-11 DIAGNOSIS — E039 Hypothyroidism, unspecified: Secondary | ICD-10-CM

## 2020-07-27 ENCOUNTER — Encounter: Payer: Self-pay | Admitting: Family Medicine

## 2020-07-27 LAB — HM PAP SMEAR: HM Pap smear: ABNORMAL

## 2020-08-05 ENCOUNTER — Telehealth: Payer: Self-pay | Admitting: *Deleted

## 2020-08-05 DIAGNOSIS — R87619 Unspecified abnormal cytological findings in specimens from cervix uteri: Secondary | ICD-10-CM

## 2020-08-05 NOTE — Telephone Encounter (Signed)
Please see note from [facility] pathology dept.

## 2020-08-11 ENCOUNTER — Other Ambulatory Visit: Payer: Self-pay

## 2020-08-11 ENCOUNTER — Encounter: Payer: Self-pay | Admitting: Obstetrics & Gynecology

## 2020-08-11 ENCOUNTER — Ambulatory Visit (INDEPENDENT_AMBULATORY_CARE_PROVIDER_SITE_OTHER): Payer: BC Managed Care – PPO | Admitting: Obstetrics & Gynecology

## 2020-08-11 VITALS — BP 118/78 | HR 73 | Resp 16 | Ht 61.25 in | Wt 189.0 lb

## 2020-08-11 DIAGNOSIS — R87619 Unspecified abnormal cytological findings in specimens from cervix uteri: Secondary | ICD-10-CM

## 2020-08-11 DIAGNOSIS — R87610 Atypical squamous cells of undetermined significance on cytologic smear of cervix (ASC-US): Secondary | ICD-10-CM

## 2020-08-11 NOTE — Progress Notes (Signed)
    Anna Pratt [birth year] [id number]   History:    65 y.o. G1P1L1  Daughter works at [employer].  1 Engineer, maintenance (IT).  RP:  New patient presenting for counseling on abnormal Pap test  HPI: ASCUS and AGUS on Pap 01/2020.  HPV HR Negative.  Postmenopause, well on no HRT.  No PMB.  No pelvic pain.  Breasts normal.  Screening mammo Neg 03/2020.  BMI 35.42.  BD 03/2020 Normal.  Per patient, up to date with Colonoscopy.  Health labs/management of Hypothyroidism with Fam MD.  Past medical history,surgical history, family history and social history were all reviewed and documented in the EPIC chart.  Gynecologic History No LMP recorded. Patient is postmenopausal.  Obstetric History OB History  Gravida Para Term Preterm AB Living  1 1 1     1   SAB IAB Ectopic Multiple Live Births               # Outcome Date GA Lbr Len/2nd Weight Sex Delivery Anes PTL Lv  1 Term              ROS: A ROS was performed and pertinent positives and negatives are included in the history.  GENERAL: No fevers or chills. HEENT: No change in vision, no earache, sore throat or sinus congestion. NECK: No pain or stiffness. CARDIOVASCULAR: No chest pain or pressure. No palpitations. PULMONARY: No shortness of breath, cough or wheeze. GASTROINTESTINAL: No abdominal pain, nausea, vomiting or diarrhea, melena or bright red blood per rectum. GENITOURINARY: No urinary frequency, urgency, hesitancy or dysuria. MUSCULOSKELETAL: No joint or muscle pain, no back pain, no recent trauma. DERMATOLOGIC: No rash, no itching, no lesions. ENDOCRINE: No polyuria, polydipsia, no heat or cold intolerance. No recent change in weight. HEMATOLOGICAL: No anemia or easy bruising or bleeding. NEUROLOGIC: No headache, seizures, numbness, tingling or weakness. PSYCHIATRIC: No depression, no loss of interest in normal activity or change in sleep pattern.     Exam:   BP 118/78   Pulse 73   Resp 16   Ht 5' 1.25" (1.556 m)   Wt 189 lb (85.7  kg)   BMI 35.42 kg/m   Body mass index is 35.42 kg/m.  General appearance : Well developed well nourished female. No acute distress  Pelvic: Deferred   Assessment/Plan:  65 y.o. female for annual exam   1. ASCUS and AGUS of cervix with negative high risk HPV Counseling done on ASCUS and AGUS.  Reassured that HPV HR is Negative.  Given that patient has AGUS, not just ASCUS, will repeat a Pap test asap rather than at 1 year.  Will schedule for a repeat Pap with Annual/Gyn exam as soon as appointment is available.  If AGUS persists, will add a Pelvic 76 to complete the investigation.  Patient voiced understanding and agreement with plan.  [place] MD, 11:05 AM 08/11/2020

## 2020-08-26 ENCOUNTER — Ambulatory Visit (INDEPENDENT_AMBULATORY_CARE_PROVIDER_SITE_OTHER): Payer: BC Managed Care – PPO | Admitting: Family Medicine

## 2020-08-26 ENCOUNTER — Encounter: Payer: Self-pay | Admitting: Family Medicine

## 2020-08-26 ENCOUNTER — Other Ambulatory Visit: Payer: Self-pay

## 2020-08-26 VITALS — BP 130/80 | HR 71 | Temp 97.6°F | Ht 61.25 in | Wt 186.4 lb

## 2020-08-26 DIAGNOSIS — E876 Hypokalemia: Secondary | ICD-10-CM

## 2020-08-26 DIAGNOSIS — E039 Hypothyroidism, unspecified: Secondary | ICD-10-CM

## 2020-08-26 DIAGNOSIS — Z13220 Encounter for screening for lipoid disorders: Secondary | ICD-10-CM

## 2020-08-26 DIAGNOSIS — Z720 Tobacco use: Secondary | ICD-10-CM

## 2020-08-26 DIAGNOSIS — I10 Essential (primary) hypertension: Secondary | ICD-10-CM

## 2020-08-26 LAB — CBC WITH DIFFERENTIAL/PLATELET
Basophils Absolute: 0 10*3/uL (ref 0.0–0.1)
Basophils Relative: 0.3 % (ref 0.0–3.0)
Eosinophils Absolute: 0.1 10*3/uL (ref 0.0–0.7)
Eosinophils Relative: 1.7 % (ref 0.0–5.0)
HCT: 42.5 % (ref 36.0–46.0)
Hemoglobin: 14.2 g/dL (ref 12.0–15.0)
Lymphocytes Relative: 36.4 % (ref 12.0–46.0)
Lymphs Abs: 3 10*3/uL (ref 0.7–4.0)
MCHC: 33.5 g/dL (ref 30.0–36.0)
MCV: 86.6 fl (ref 78.0–100.0)
Monocytes Absolute: 0.5 10*3/uL (ref 0.1–1.0)
Monocytes Relative: 5.4 % (ref 3.0–12.0)
Neutro Abs: 4.6 10*3/uL (ref 1.4–7.7)
Neutrophils Relative %: 56.2 % (ref 43.0–77.0)
Platelets: 227 10*3/uL (ref 150.0–400.0)
RBC: 4.9 Mil/uL (ref 3.87–5.11)
RDW: 15.3 % (ref 11.5–15.5)
WBC: 8.3 10*3/uL (ref 4.0–10.5)

## 2020-08-26 LAB — COMPREHENSIVE METABOLIC PANEL
ALT: 18 U/L (ref 0–35)
AST: 23 U/L (ref 0–37)
Albumin: 4.5 g/dL (ref 3.5–5.2)
Alkaline Phosphatase: 77 U/L (ref 39–117)
BUN: 16 mg/dL (ref 6–23)
CO2: 29 mEq/L (ref 19–32)
Calcium: 9.8 mg/dL (ref 8.4–10.5)
Chloride: 104 mEq/L (ref 96–112)
Creatinine, Ser: 0.81 mg/dL (ref 0.40–1.20)
GFR: 76.12 mL/min (ref >60.00)
Glucose, Bld: 81 mg/dL (ref 70–99)
Potassium: 4.3 mEq/L (ref 3.5–5.1)
Sodium: 140 mEq/L (ref 135–145)
Total Bilirubin: 0.4 mg/dL (ref 0.2–1.2)
Total Protein: 7.2 g/dL (ref 6.0–8.3)

## 2020-08-26 LAB — LIPID PANEL
Cholesterol: 181 mg/dL (ref 0–200)
HDL: 61.6 mg/dL (ref >39.00)
LDL Cholesterol: 104 mg/dL — ABNORMAL HIGH (ref 0–99)
NonHDL: 119.29
Total CHOL/HDL Ratio: 3
Triglycerides: 75 mg/dL (ref 0.0–149.0)
VLDL: 15 mg/dL (ref 0.0–40.0)

## 2020-08-26 LAB — TSH: TSH: 5.21 u[IU]/mL (ref 0.35–5.50)

## 2020-08-26 MED ORDER — VARENICLINE TARTRATE 0.5 MG PO TABS: ORAL_TABLET | ORAL | 2 refills | Status: DC

## 2020-08-26 MED ORDER — TRIAMTERENE-HCTZ 37.5-25 MG PO TABS: 1.0000 | ORAL_TABLET | Freq: Every day | ORAL | 1 refills | Status: DC

## 2020-08-26 MED ORDER — SYNTHROID 150 MCG PO TABS: ORAL_TABLET | ORAL | 1 refills | Status: DC

## 2020-08-26 MED ORDER — POTASSIUM CHLORIDE ER 10 MEQ PO TBCR: 10.0000 meq | EXTENDED_RELEASE_TABLET | Freq: Every day | ORAL | 1 refills | Status: DC

## 2020-08-26 MED ORDER — AMLODIPINE BESYLATE 5 MG PO TABS
5.0000 mg | ORAL_TABLET | Freq: Every day | ORAL | 1 refills | Status: DC
Start: 2020-08-26 — End: 2021-07-31

## 2020-08-26 NOTE — Progress Notes (Signed)
Anna Pratt DOB: May 20, 1955 Encounter date: 08/26/2020  This is a 65 y.o. female who presents with Chief Complaint  Patient presents with   Follow-up    History of present illness: She did follow up with gyn this month. Has follow up for repeat pap in a couple of months.   KZS:WFUXNATFTD 5mg  daily, maxzide 25. Not checking at home.  Hypothyroid: synthroid daily  Energy level feeling ok, but depends on day. Working 3rd shift. She catches up on sleep at night. Planning to go to day shift. Planning on working to keep losing weight. Will be exercising more when doing day shift. Cut out sodas, snacks.   No Known Allergies Current Meds  Medication Sig   Omega-3 Fatty Acids (FISH OIL PO) Take by mouth.   [DISCONTINUED] amLODipine (NORVASC) 5 MG tablet Take 1 tablet by mouth once daily   [DISCONTINUED] potassium chloride (KLOR-CON) 10 MEQ tablet Take 1 tablet by mouth once daily   [DISCONTINUED] SYNTHROID 150 MCG tablet TAKE 1 TABLET BY MOUTH BEFORE BREAKFAST   [DISCONTINUED] triamterene-hydrochlorothiazide (MAXZIDE-25) 37.5-25 MG tablet Take 1 tablet by mouth once daily    Review of Systems  Constitutional:  Negative for chills, fatigue and fever.  Respiratory:  Negative for cough, chest tightness, shortness of breath and wheezing.   Cardiovascular:  Negative for chest pain, palpitations and leg swelling.   Objective:  BP 130/80 (BP Location: Left Arm, Patient Position: Sitting, Cuff Size: Large)   Pulse 71   Temp 97.6 F (36.4 C) (Oral)   Ht 5' 1.25" (1.556 m)   Wt 186 lb 6.4 oz (84.6 kg)   SpO2 98%   BMI 34.93 kg/m   Weight: 186 lb 6.4 oz (84.6 kg)   BP Readings from Last 3 Encounters:  08/26/20 130/80  08/11/20 118/78  02/26/20 118/70   Wt Readings from Last 3 Encounters:  08/26/20 186 lb 6.4 oz (84.6 kg)  08/11/20 189 lb (85.7 kg)  02/26/20 190 lb 1.6 oz (86.2 kg)    Physical Exam Vitals reviewed.  Constitutional:      General: She is not  in acute distress.    Appearance: She is well-developed.  Cardiovascular:     Rate and Rhythm: Normal rate and regular rhythm.     Heart sounds: Normal heart sounds. No murmur heard.   No friction rub.  Pulmonary:     Effort: Pulmonary effort is normal. No respiratory distress.     Breath sounds: Normal breath sounds. No wheezing or rales.  Musculoskeletal:     Right lower leg: No edema.     Left lower leg: No edema.  Neurological:     Mental Status: She is alert and oriented to person, place, and time.  Psychiatric:        Behavior: Behavior normal.    Assessment/Plan 1. Primary hypertension Well controlled. Continue with current medications. Should improve further with weight loss.   2. Hypothyroidism, unspecified type - TSH; Future - SYNTHROID 150 MCG tablet; TAKE 1 TABLET BY MOUTH BEFORE BREAKFAST  Dispense: 90 tablet; Refill: 1  3. Lipid screening - Lipid panel; Future  4. Hypokalemia Has been stable with supplement - potassium chloride (KLOR-CON) 10 MEQ tablet; Take 1 tablet (10 mEq total) by mouth daily.  Dispense: 90 tablet; Refill: 1  Wants to quit smoking; really wants to quit before she starts on exercise routine. Did well with chantix in the past.   Return in about 6 months (around 02/26/2021) for physical exam.  Micheline Rough, MD

## 2020-08-26 NOTE — Addendum Note (Signed)
Addended by: Kandra Nicolas on: 08/26/2020 10:58 AM   Modules accepted: Orders

## 2020-09-13 ENCOUNTER — Telehealth: Payer: Self-pay | Admitting: *Deleted

## 2020-09-13 ENCOUNTER — Telehealth: Payer: Self-pay

## 2020-09-13 MED ORDER — BUPROPION HCL ER (SR) 150 MG PO TB12: ORAL_TABLET | ORAL | 2 refills | Status: DC

## 2020-09-13 NOTE — Telephone Encounter (Signed)
Dr Hassan Rowan received a fax from [facility] stating the Rx for Varenicline 0.5mg  has plan exclusion with cost of $1,054 and cost with GoodRx will be $754.25 and the patient cannot afford this.  Per Dr Hassan Rowan the only other option is Wellbutrin (Zyban) which she has used once in the past and she could try this again if she prefers.  Patient was informed, agreed to try Wellbutrin again and is aware the Rx was sent to [facility].

## 2020-11-04 ENCOUNTER — Other Ambulatory Visit: Payer: Self-pay

## 2020-11-04 ENCOUNTER — Other Ambulatory Visit (HOSPITAL_COMMUNITY)
Admission: RE | Admit: 2020-11-04 | Discharge: 2020-11-04 | Disposition: A | Discharge status: Home / Self Care | Payer: BC Managed Care – PPO | Source: Ambulatory Visit | Attending: Obstetrics & Gynecology | Admitting: Obstetrics & Gynecology

## 2020-11-04 ENCOUNTER — Encounter: Payer: Self-pay | Admitting: Obstetrics & Gynecology

## 2020-11-04 ENCOUNTER — Ambulatory Visit (INDEPENDENT_AMBULATORY_CARE_PROVIDER_SITE_OTHER): Payer: BC Managed Care – PPO | Admitting: Obstetrics & Gynecology

## 2020-11-04 VITALS — BP 120/76 | Ht 61.75 in | Wt 189.0 lb

## 2020-11-04 DIAGNOSIS — R87619 Unspecified abnormal cytological findings in specimens from cervix uteri: Secondary | ICD-10-CM | POA: Insufficient documentation

## 2020-11-04 DIAGNOSIS — R87610 Atypical squamous cells of undetermined significance on cytologic smear of cervix (ASC-US): Secondary | ICD-10-CM | POA: Diagnosis not present

## 2020-11-04 NOTE — Progress Notes (Signed)
    Anna Pratt 08-22-1955 [id number]        65 y.o.  G1P1001   RP: AGUS/ASCUS with Neg HR HPV in 01/2020 for repeat Pap test  HPI: ASCUS and AGUS on Pap 01/2020.  HPV HR Negative.  Postmenopause, well on no HRT.  No PMB.  No pelvic pain.  Urine/BMs normal.   OB History  Gravida Para Term Preterm AB Living  1 1 1     1   SAB IAB Ectopic Multiple Live Births               # Outcome Date GA Lbr Len/2nd Weight Sex Delivery Anes PTL Lv  1 Term             Past medical history,surgical history, problem list, medications, allergies, family history and social history were all reviewed and documented in the EPIC chart.   Directed ROS with pertinent positives and negatives documented in the history of present illness/assessment and plan.  Exam:  Vitals:   11/04/20 1518  BP: 120/76  Weight: 189 lb (85.7 kg)  Height: 5' 1.75" (1.568 m)   General appearance:  Normal   Gynecologic exam: Vulva normal.  Speculum:  Cervix/Vagina normal.  Pap reflex done.   Assessment/Plan:  65 y.o. G1P1001   1. ASCUS of cervix with negative high risk HPV ASCUS with negative high risk HPV January 2022.  Repeat Pap reflex done today. - Cytology - PAP( [place])  2. Atypical glandular cells of undetermined significance (AGUS) on cervical Pap smear AGUS on Pap January 2022.  If still present we will proceed to colposcopy and pelvic ultrasound. [facility]   [facility] MD, 4:04 PM 11/04/2020

## 2020-11-08 LAB — CYTOLOGY - PAP: Diagnosis: NEGATIVE

## 2021-01-26 DIAGNOSIS — J069 Acute upper respiratory infection, unspecified: Secondary | ICD-10-CM | POA: Diagnosis not present

## 2021-01-26 DIAGNOSIS — Z20822 Contact with and (suspected) exposure to covid-19: Secondary | ICD-10-CM | POA: Diagnosis not present

## 2021-03-03 ENCOUNTER — Encounter: Payer: Self-pay | Admitting: Family Medicine

## 2021-03-03 ENCOUNTER — Ambulatory Visit (INDEPENDENT_AMBULATORY_CARE_PROVIDER_SITE_OTHER): Payer: BC Managed Care – PPO | Admitting: Family Medicine

## 2021-03-03 VITALS — BP 122/80 | HR 89 | Temp 98.4°F | Ht 61.0 in | Wt 191.8 lb

## 2021-03-03 DIAGNOSIS — I10 Essential (primary) hypertension: Secondary | ICD-10-CM

## 2021-03-03 DIAGNOSIS — D72829 Elevated white blood cell count, unspecified: Secondary | ICD-10-CM

## 2021-03-03 DIAGNOSIS — Z0000 Encounter for general adult medical examination without abnormal findings: Secondary | ICD-10-CM | POA: Diagnosis not present

## 2021-03-03 DIAGNOSIS — E039 Hypothyroidism, unspecified: Secondary | ICD-10-CM

## 2021-03-03 DIAGNOSIS — Z23 Encounter for immunization: Secondary | ICD-10-CM | POA: Diagnosis not present

## 2021-03-03 LAB — CBC WITH DIFFERENTIAL/PLATELET
Basophils Absolute: 0.1 10*3/uL (ref 0.0–0.1)
Basophils Relative: 0.5 % (ref 0.0–3.0)
Eosinophils Absolute: 0.2 10*3/uL (ref 0.0–0.7)
Eosinophils Relative: 1.7 % (ref 0.0–5.0)
HCT: 43.2 % (ref 36.0–46.0)
Hemoglobin: 14.3 g/dL (ref 12.0–15.0)
Lymphocytes Relative: 36.9 % (ref 12.0–46.0)
Lymphs Abs: 4.1 10*3/uL — ABNORMAL HIGH (ref 0.7–4.0)
MCHC: 33.2 g/dL (ref 30.0–36.0)
MCV: 87.2 fl (ref 78.0–100.0)
Monocytes Absolute: 0.5 10*3/uL (ref 0.1–1.0)
Monocytes Relative: 4.9 % (ref 3.0–12.0)
Neutro Abs: 6.3 10*3/uL (ref 1.4–7.7)
Neutrophils Relative %: 56 % (ref 43.0–77.0)
Platelets: 258 10*3/uL (ref 150.0–400.0)
RBC: 4.96 Mil/uL (ref 3.87–5.11)
RDW: 14.7 % (ref 11.5–15.5)
WBC: 11.2 10*3/uL — ABNORMAL HIGH (ref 4.0–10.5)

## 2021-03-03 LAB — COMPREHENSIVE METABOLIC PANEL
ALT: 21 U/L (ref 0–35)
AST: 26 U/L (ref 0–37)
Albumin: 4.7 g/dL (ref 3.5–5.2)
Alkaline Phosphatase: 70 U/L (ref 39–117)
BUN: 26 mg/dL — ABNORMAL HIGH (ref 6–23)
CO2: 31 mEq/L (ref 19–32)
Calcium: 10.1 mg/dL (ref 8.4–10.5)
Chloride: 102 mEq/L (ref 96–112)
Creatinine, Ser: 1.04 mg/dL (ref 0.40–1.20)
GFR: 56.19 mL/min — ABNORMAL LOW (ref >60.00)
Glucose, Bld: 65 mg/dL — ABNORMAL LOW (ref 70–99)
Potassium: 3.7 mEq/L (ref 3.5–5.1)
Sodium: 138 mEq/L (ref 135–145)
Total Bilirubin: 0.4 mg/dL (ref 0.2–1.2)
Total Protein: 8 g/dL (ref 6.0–8.3)

## 2021-03-03 LAB — TSH: TSH: 11.16 u[IU]/mL — ABNORMAL HIGH (ref 0.35–5.50)

## 2021-03-03 NOTE — Progress Notes (Signed)
Anna Pratt DOB: 28-Nov-1955 Encounter date: 03/03/2021  This is a 66 y.o. female who presents for complete physical   History of present illness/Additional concerns: Last visit with me was 08/26/2020.  Hypertension: Amlodipine 5 mg daily, Maxide 25.  She replace potassium with 10 mEq daily. Just getting off work; took meds already this morning. Doesn't always take the water pill; sometimes vagina gets irritated with taking water pill; not keeping up with water. Feels sometimes related to soda. Uses vasoline and antibiotic ointment and this helps.   Hypothyroid: 150 mcg Synthroid daily. Takes this regularly.   At last visit she wanted to quit smoking and Chantix was written for.  Due to insurance coverage, Wellbutrin was substituted instead. Wellbutrin does helps. Not smoking as much. More issue when she goes to work. Smokes on breaks. Doesn't feel like the wellbutrin helps as much as the chantix. Less than 5/day.   Follows with GYN, Dr.Lavoie for gyn care.  She had ASCUS and AGUS On prior pap. Repeat obtained 11/04/20. This was normal.   Last mammogram 04/08/2020: Normal.  Last colonoscopy 01/29/2017; repeat in 01/2027  Past Medical History:  Diagnosis Date   Abnormal Pap smear of cervix    02-26-20   Hypertension    Osteoarthritis, knee    Thyroid goiter    Past Surgical History:  Procedure Laterality Date   ANKLE ARTHROPLASTY     done after injury   [place]     THYROID SURGERY     due to goiter 32 years ago   No Known Allergies Current Meds  Medication Sig   amLODipine (NORVASC) 5 MG tablet Take 1 tablet (5 mg total) by mouth daily.   buPROPion (WELLBUTRIN SR) 150 MG 12 hr tablet Take 1 tablet by mouth daily for 7 days, stop smoking on day 8, then increase to BID on day 8   Omega-3 Fatty Acids (FISH OIL PO) Take by mouth.   potassium chloride (KLOR-CON) 10 MEQ tablet Take 1 tablet (10 mEq total) by mouth daily.    SYNTHROID 150 MCG tablet TAKE 1 TABLET BY MOUTH BEFORE BREAKFAST   triamterene-hydrochlorothiazide (MAXZIDE-25) 37.5-25 MG tablet Take 1 tablet by mouth daily.   Social History   Tobacco Use   Smoking status: Former    Packs/day: 0.50    Years: 42.00    Pack years: 21.00    Types: Cigarettes    Quit date: 06/21/2019    Years since quitting: 1.7   Smokeless tobacco: Never  Substance Use Topics   Alcohol use: Yes    Comment: socially   Family History  Problem Relation Age of Onset   Hypertension Father    Heart attack Father    Bone cancer Sister 16   Diabetes Maternal Grandmother      Review of Systems  Constitutional:  Negative for activity change, appetite change, chills, fatigue, fever and unexpected weight change.  HENT:  Negative for congestion, ear pain, hearing loss, sinus pressure, sinus pain, sore throat and trouble swallowing.   Eyes:  Negative for pain and visual disturbance.  Respiratory:  Negative for cough, chest tightness, shortness of breath and wheezing.   Cardiovascular:  Negative for chest pain, palpitations and leg swelling.  Gastrointestinal:  Negative for abdominal pain, blood in stool, constipation, diarrhea, nausea and vomiting.  Genitourinary:  Negative for difficulty urinating and menstrual problem.  Musculoskeletal:  Negative for arthralgias and back pain.  Skin:  Negative for rash.  Neurological:  Negative for dizziness, weakness, numbness and headaches.  Hematological:  Negative for adenopathy. Does not bruise/bleed easily.  Psychiatric/Behavioral:  Negative for sleep disturbance and suicidal ideas. The patient is not nervous/anxious.    CBC:  Lab Results  Component Value Date   WBC 8.3 08/26/2020   HGB 14.2 08/26/2020   HCT 42.5 08/26/2020   MCH 29.4 02/26/2020   MCHC 33.5 08/26/2020   RDW 15.3 08/26/2020   PLT 227.0 08/26/2020   MPV 10.8 02/26/2020   CMP: Lab Results  Component Value Date   NA 140 08/26/2020   K 4.3 08/26/2020    CL 104 08/26/2020   CO2 29 08/26/2020   GLUCOSE 81 08/26/2020   BUN 16 08/26/2020   CREATININE 0.81 08/26/2020   CREATININE 0.80 02/26/2020   GFRAA 90 02/26/2020   CALCIUM 9.8 08/26/2020   PROT 7.2 08/26/2020   BILITOT 0.4 08/26/2020   ALKPHOS 77 08/26/2020   ALT 18 08/26/2020   AST 23 08/26/2020   LIPID: Lab Results  Component Value Date   CHOL 181 08/26/2020   TRIG 75.0 08/26/2020   HDL 61.60 08/26/2020   LDLCALC 104 (H) 08/26/2020   LDLCALC 101 (H) 08/21/2019    Objective:  BP 122/80    Pulse 89    Temp 98.4 F (36.9 C) (Oral)    Ht 5\' 1"  (1.549 m)    Wt 191 lb 12.8 oz (87 kg)    SpO2 96%    BMI 36.24 kg/m   Weight: 191 lb 12.8 oz (87 kg)   BP Readings from Last 3 Encounters:  03/03/21 122/80  11/04/20 120/76  08/26/20 130/80   Wt Readings from Last 3 Encounters:  03/03/21 191 lb 12.8 oz (87 kg)  11/04/20 189 lb (85.7 kg)  08/26/20 186 lb 6.4 oz (84.6 kg)    Physical Exam Constitutional:      General: She is not in acute distress.    Appearance: She is well-developed.  HENT:     Head: Normocephalic and atraumatic.     Right Ear: External ear normal.     Left Ear: External ear normal.     Mouth/Throat:     Pharynx: No oropharyngeal exudate.  Eyes:     Conjunctiva/sclera: Conjunctivae normal.     Pupils: Pupils are equal, round, and reactive to light.  Neck:     Thyroid: No thyromegaly.  Cardiovascular:     Rate and Rhythm: Normal rate and regular rhythm.     Heart sounds: Normal heart sounds. No murmur heard.   No friction rub. No gallop.  Pulmonary:     Effort: Pulmonary effort is normal.     Breath sounds: Normal breath sounds.  Abdominal:     General: Bowel sounds are normal. There is no distension.     Palpations: Abdomen is soft. There is no mass.     Tenderness: There is no abdominal tenderness. There is no guarding.     Hernia: No hernia is present.  Musculoskeletal:        General: No tenderness or deformity. Normal range of motion.      Cervical back: Normal range of motion and neck supple.  Lymphadenopathy:     Cervical: No cervical adenopathy.  Skin:    General: Skin is warm and dry.     Findings: No rash.  Neurological:     Mental Status: She is alert and oriented to person, place, and time.  Deep Tendon Reflexes: Reflexes normal.     Reflex Scores:      Tricep reflexes are 2+ on the right side and 2+ on the left side.      Bicep reflexes are 2+ on the right side and 2+ on the left side.      Brachioradialis reflexes are 2+ on the right side and 2+ on the left side.      Patellar reflexes are 2+ on the right side and 2+ on the left side. Psychiatric:        Speech: Speech normal.        Behavior: Behavior normal.        Thought Content: Thought content normal.    Assessment/Plan: Health Maintenance Due  Topic Date Due   Pneumonia Vaccine 18+ Years old (2 - PCV) 03/11/2021   Health Maintenance reviewed - encouraged shingrix, flu shots. She plans to start exercise routine.   1. Preventative health care Start exercise; work on cutting out last few cigarettes.  - Pneumococcal conjugate vaccine 20-valent (Prevnar 20)  2. Primary hypertension Keep up with current meds.  - CBC with Differential/Platelet; Future - Comprehensive metabolic panel; Future  3. Hypothyroidism, unspecified type Synthroid 148mcg. Recheck labs today - TSH; Future   Return in about 6 months (around 08/31/2021) for Chronic condition visit.  Micheline Rough, MD

## 2021-03-03 NOTE — Patient Instructions (Signed)
I would recommend shingrix vaccine as well as flu vaccine for preventative health.   You have done a great job cutting back on cigarettes! If you can cut out those last few, it will benefit you from all health standpoints - heart,lung, blood pressure.

## 2021-03-06 MED ORDER — LEVOTHYROXINE SODIUM 175 MCG PO TABS: 175.0000 ug | ORAL_TABLET | Freq: Every day | ORAL | 1 refills | Status: DC

## 2021-03-06 NOTE — Addendum Note (Signed)
Addended by: Agnes Lawrence on: 03/06/2021 02:20 PM   Modules accepted: Orders

## 2021-05-08 ENCOUNTER — Other Ambulatory Visit (INDEPENDENT_AMBULATORY_CARE_PROVIDER_SITE_OTHER): Payer: BC Managed Care – PPO

## 2021-05-08 DIAGNOSIS — D72829 Elevated white blood cell count, unspecified: Secondary | ICD-10-CM

## 2021-05-08 DIAGNOSIS — E039 Hypothyroidism, unspecified: Secondary | ICD-10-CM

## 2021-05-08 LAB — CBC WITH DIFFERENTIAL/PLATELET
Basophils Absolute: 0 10*3/uL (ref 0.0–0.1)
Basophils Relative: 0.3 % (ref 0.0–3.0)
Eosinophils Absolute: 0.1 10*3/uL (ref 0.0–0.7)
Eosinophils Relative: 1.2 % (ref 0.0–5.0)
HCT: 42.4 % (ref 36.0–46.0)
Hemoglobin: 14.1 g/dL (ref 12.0–15.0)
Lymphocytes Relative: 33 % (ref 12.0–46.0)
Lymphs Abs: 3.9 10*3/uL (ref 0.7–4.0)
MCHC: 33.3 g/dL (ref 30.0–36.0)
MCV: 87.1 fl (ref 78.0–100.0)
Monocytes Absolute: 0.7 10*3/uL (ref 0.1–1.0)
Monocytes Relative: 5.6 % (ref 3.0–12.0)
Neutro Abs: 7.1 10*3/uL (ref 1.4–7.7)
Neutrophils Relative %: 59.9 % (ref 43.0–77.0)
Platelets: 252 10*3/uL (ref 150.0–400.0)
RBC: 4.87 Mil/uL (ref 3.87–5.11)
RDW: 14.7 % (ref 11.5–15.5)
WBC: 11.9 10*3/uL — ABNORMAL HIGH (ref 4.0–10.5)

## 2021-05-08 LAB — TSH: TSH: 0.36 u[IU]/mL (ref 0.35–5.50)

## 2021-05-17 DIAGNOSIS — J208 Acute bronchitis due to other specified organisms: Secondary | ICD-10-CM | POA: Diagnosis not present

## 2021-05-17 DIAGNOSIS — R0981 Nasal congestion: Secondary | ICD-10-CM | POA: Diagnosis not present

## 2021-05-17 DIAGNOSIS — R059 Cough, unspecified: Secondary | ICD-10-CM | POA: Diagnosis not present

## 2021-05-17 DIAGNOSIS — I110 Hypertensive heart disease with heart failure: Secondary | ICD-10-CM | POA: Diagnosis not present

## 2021-05-17 DIAGNOSIS — I43 Cardiomyopathy in diseases classified elsewhere: Secondary | ICD-10-CM | POA: Diagnosis not present

## 2021-05-17 DIAGNOSIS — B9689 Other specified bacterial agents as the cause of diseases classified elsewhere: Secondary | ICD-10-CM | POA: Diagnosis not present

## 2021-05-17 DIAGNOSIS — R058 Other specified cough: Secondary | ICD-10-CM | POA: Diagnosis not present

## 2021-05-18 DIAGNOSIS — I517 Cardiomegaly: Secondary | ICD-10-CM | POA: Diagnosis not present

## 2021-05-26 ENCOUNTER — Telehealth: Payer: Self-pay | Admitting: Family Medicine

## 2021-05-26 ENCOUNTER — Ambulatory Visit (INDEPENDENT_AMBULATORY_CARE_PROVIDER_SITE_OTHER): Payer: BC Managed Care – PPO | Admitting: Family Medicine

## 2021-05-26 ENCOUNTER — Encounter: Payer: Self-pay | Admitting: Family Medicine

## 2021-05-26 VITALS — BP 118/70 | HR 75 | Temp 97.6°F | Ht 61.0 in | Wt 187.6 lb

## 2021-05-26 DIAGNOSIS — R7301 Impaired fasting glucose: Secondary | ICD-10-CM

## 2021-05-26 DIAGNOSIS — I10 Essential (primary) hypertension: Secondary | ICD-10-CM

## 2021-05-26 DIAGNOSIS — Z720 Tobacco use: Secondary | ICD-10-CM | POA: Diagnosis not present

## 2021-05-26 DIAGNOSIS — E039 Hypothyroidism, unspecified: Secondary | ICD-10-CM

## 2021-05-26 DIAGNOSIS — Z13220 Encounter for screening for lipoid disorders: Secondary | ICD-10-CM

## 2021-05-26 DIAGNOSIS — R0981 Nasal congestion: Secondary | ICD-10-CM | POA: Diagnosis not present

## 2021-05-26 DIAGNOSIS — E6609 Other obesity due to excess calories: Secondary | ICD-10-CM | POA: Diagnosis not present

## 2021-05-26 DIAGNOSIS — R058 Other specified cough: Secondary | ICD-10-CM | POA: Diagnosis not present

## 2021-05-26 DIAGNOSIS — I43 Cardiomyopathy in diseases classified elsewhere: Secondary | ICD-10-CM | POA: Diagnosis not present

## 2021-05-26 DIAGNOSIS — I110 Hypertensive heart disease with heart failure: Secondary | ICD-10-CM | POA: Diagnosis not present

## 2021-05-26 LAB — LIPID PANEL
Cholesterol: 184 mg/dL (ref 0–200)
HDL: 64.2 mg/dL (ref >39.00)
LDL Cholesterol: 105 mg/dL — ABNORMAL HIGH (ref 0–99)
NonHDL: 119.5
Total CHOL/HDL Ratio: 3
Triglycerides: 72 mg/dL (ref 0.0–149.0)
VLDL: 14.4 mg/dL (ref 0.0–40.0)

## 2021-05-26 LAB — COMPREHENSIVE METABOLIC PANEL
ALT: 22 U/L (ref 0–35)
AST: 24 U/L (ref 0–37)
Albumin: 4.5 g/dL (ref 3.5–5.2)
Alkaline Phosphatase: 76 U/L (ref 39–117)
BUN: 17 mg/dL (ref 6–23)
CO2: 31 mEq/L (ref 19–32)
Calcium: 9.9 mg/dL (ref 8.4–10.5)
Chloride: 103 mEq/L (ref 96–112)
Creatinine, Ser: 0.81 mg/dL (ref 0.40–1.20)
GFR: 75.72 mL/min (ref >60.00)
Glucose, Bld: 86 mg/dL (ref 70–99)
Potassium: 3.6 mEq/L (ref 3.5–5.1)
Sodium: 139 mEq/L (ref 135–145)
Total Bilirubin: 0.4 mg/dL (ref 0.2–1.2)
Total Protein: 7.7 g/dL (ref 6.0–8.3)

## 2021-05-26 LAB — HEMOGLOBIN A1C: Hgb A1c MFr Bld: 5.9 % (ref 4.6–6.5)

## 2021-05-26 MED ORDER — BUPROPION HCL ER (SR) 150 MG PO TB12: ORAL_TABLET | ORAL | 2 refills | Status: DC

## 2021-05-26 NOTE — Telephone Encounter (Signed)
ok 

## 2021-05-26 NOTE — Patient Instructions (Signed)
*  try the wellbutrin for smoking cessation. Let me know how this works! Feel free to add nicotine replacement along with this.  ?

## 2021-05-26 NOTE — Telephone Encounter (Signed)
Patient wants to do a TOC from [place] to [place]. Okay to schedule? ? ? ? ? ? ? ?Please advise  ?

## 2021-05-26 NOTE — Progress Notes (Signed)
?Anna Pratt ?DOB: Mar 26, 1955 ?Encounter date: 05/26/2021 ? ?This is a 66 y.o. female who presents with ?Chief Complaint  ?Patient presents with  ? Rash  ?  Patient complains of a itchy red rash noted on her back for 3-4 days, seen at an urgent care on April 19th  ? Results  ?  States she was seen at an urgent care on April 19th due to the rash on her back and was told her glucose was elevated  ? ? ?History of present illness: ?*worried about A1C being up to 6.1 at urgent care on 4/19.  ? ?*cbc was normal except for the mchc slightly low. Wbc has normalized.  ? ?Seeing cardiology later today. Had echo completed.  ? ?When she went to urgent care had bronchial infection and was congested. She was coughing up mucous at that time. Took anbiotic. Rash has resolved.  ? ?Evaluation appears to have occurred due to concern with rales on exam that sounded worrisome for CHF. Last abx was 2 days ago. She feels better and has been walking last couple of days. Started smoking again 1 month ago.  ? ?QVZ:DGLOVFIEP with meds.  ?Hypothyroid: TSH normal ? ? ? ?No Known Allergies ?No outpatient medications have been marked as taking for the 05/26/21 encounter (Office Visit) with Wynn Banker, MD.  ? ? ?Review of Systems  ?Constitutional:  Negative for chills, fatigue and fever.  ?Respiratory:  Negative for cough, chest tightness, shortness of breath and wheezing.   ?Cardiovascular:  Negative for chest pain, palpitations and leg swelling.  ? ?Objective: ? ?BP 118/70 (BP Location: Left Arm, Patient Position: Sitting, Cuff Size: Large)   Pulse 75   Temp 97.6 ?F (36.4 ?C) (Oral)   Ht 5\' 1"  (1.549 m)   Wt 187 lb 9.6 oz (85.1 kg)   SpO2 98%   BMI 35.45 kg/m?   Weight: 187 lb 9.6 oz (85.1 kg)  ? ?BP Readings from Last 3 Encounters:  ?05/26/21 118/70  ?03/03/21 122/80  ?11/04/20 120/76  ? ?Wt Readings from Last 3 Encounters:  ?05/26/21 187 lb 9.6 oz (85.1 kg)  ?03/03/21 191 lb 12.8 oz (87 kg)  ?11/04/20 189 lb (85.7  kg)  ? ? ?Physical Exam ?Constitutional:   ?   General: She is not in acute distress. ?   Appearance: She is well-developed.  ?Cardiovascular:  ?   Rate and Rhythm: Normal rate and regular rhythm.  ?   Heart sounds: Normal heart sounds. No murmur heard. ?  No friction rub.  ?Pulmonary:  ?   Effort: Pulmonary effort is normal. No respiratory distress.  ?   Breath sounds: Decreased breath sounds and rhonchi (diffuse) present. No wheezing or rales.  ?Musculoskeletal:  ?   Right lower leg: No edema.  ?   Left lower leg: No edema.  ?Neurological:  ?   Mental Status: She is alert and oriented to person, place, and time.  ?Psychiatric:     ?   Behavior: Behavior normal.  ? ? ?Assessment/Plan ?1. Tobacco abuse ?- Ambulatory Referral Lung Cancer Screening [facility] ? ?2. Hypothyroidism, unspecified type ?Levothyroxine 175 mcg daily. ? ?3. Impaired fasting glucose ?- Hemoglobin A1c; Future ?- Hemoglobin A1c ? ?4. Primary hypertension ?Blood pressure stable.  Continue amlodipine 5 mg daily, Maxide-25. ?- Comprehensive metabolic panel; Future ?- Comprehensive metabolic panel ? ?5. Lipid screening ?- Lipid panel; Future ?- Lipid panel ? ? ? ?Return in about 3 months (around 08/25/2021) for labwork. ? ? ? ?  Micheline Rough, MD ?

## 2021-05-26 NOTE — Telephone Encounter (Signed)
Fine with me

## 2021-05-29 DIAGNOSIS — I509 Heart failure, unspecified: Secondary | ICD-10-CM | POA: Diagnosis not present

## 2021-05-29 DIAGNOSIS — I110 Hypertensive heart disease with heart failure: Secondary | ICD-10-CM | POA: Diagnosis not present

## 2021-05-29 DIAGNOSIS — I429 Cardiomyopathy, unspecified: Secondary | ICD-10-CM | POA: Diagnosis not present

## 2021-07-07 NOTE — Progress Notes (Unsigned)
ACUTE VISIT Chief Complaint  Patient presents with   Back Pain     Lower back radiates down to butt then right thigh. Been going on for 2 wks. Aching pain. Taking motrin.     HPI: [name] is a 66 y.o. female with history of hypothyroidism, tobacco use disorder, and hypertension here today complaining of right-sided back pain radiated to posterior aspect of right thigh.  Back Pain This is a new problem. Associated symptoms include leg pain. Pertinent negatives include no abdominal pain, bladder incontinence, bowel incontinence, dysuria, fever, numbness or weakness. Risk factors include menopause and obesity.  Leg pain interferes with sleep. No unusual edema or erythema.  Hx of lower back pain, had lumbar MRI and Dx'ed with DDD. Epidural injections years ago.  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  BP elevated today. 3rd shift, worked last night. She is on triamterene-HCTZ 37.5-25 mg daily and amlodipine 5 mg daily. Home BP readings: "Fine." Negative for severe/frequent headache, visual changes, chest pain, dyspnea, palpitation, or focal weakness.  Lab Results  Component Value Date   CREATININE 0.81 05/26/2021   BUN 17 05/26/2021   NA 139 05/26/2021   K 3.6 05/26/2021   CL 103 05/26/2021   CO2 31 05/26/2021   Review of Systems  Constitutional:  Positive for fatigue. Negative for activity change, appetite change and fever.  Respiratory:  Negative for cough and wheezing.   Gastrointestinal:  Negative for abdominal pain, bowel incontinence, nausea and vomiting.  Genitourinary:  Negative for bladder incontinence, decreased urine volume, dysuria and hematuria.  Musculoskeletal:  Positive for back pain. Negative for gait problem.  Skin:  Negative for rash.  Neurological:  Negative for syncope, facial asymmetry, weakness and numbness.  Rest see pertinent positives and negatives per HPI.  Current Outpatient Medications on File Prior to Visit  Medication Sig  Dispense Refill   amLODipine (NORVASC) 5 MG tablet Take 1 tablet (5 mg total) by mouth daily. 90 tablet 1   buPROPion (WELLBUTRIN SR) 150 MG 12 hr tablet Take 1 tablet by mouth daily for 7 days, stop smoking on day 8, then increase to BID on day 8 60 tablet 2   levothyroxine (SYNTHROID) 175 MCG tablet Take 1 tablet (175 mcg total) by mouth daily. 90 tablet 1   Omega-3 Fatty Acids (FISH OIL PO) Take by mouth.     potassium chloride (KLOR-CON) 10 MEQ tablet Take 1 tablet (10 mEq total) by mouth daily. 90 tablet 1   triamterene-hydrochlorothiazide (MAXZIDE-25) 37.5-25 MG tablet Take 1 tablet by mouth daily. 90 tablet 1   No current facility-administered medications on file prior to visit.   Past Medical History:  Diagnosis Date   Abnormal Pap smear of cervix    02-26-20   Hypertension    Osteoarthritis, knee    Thyroid goiter    No Known Allergies  Social History   Socioeconomic History   Marital status: Single    Spouse name: Not on file   Number of children: Not on file   Years of education: Not on file   Highest education level: Not on file  Occupational History   Not on file  Tobacco Use   Smoking status: Every Day    Packs/day: 0.50    Years: 42.00    Total pack years: 21.00    Types: Cigarettes    Last attempt to quit: 06/21/2019    Years since quitting: 2.0   Smokeless tobacco: Never  Substance and Sexual Activity  Alcohol use: Yes    Comment: socially   Drug use: Never   Sexual activity: Not Currently    Partners: Male    Birth control/protection: Post-menopausal  Other Topics Concern   Not on file  Social History Narrative   Not on file   Social Determinants of Health   Financial Resource Strain: Not on file  Food Insecurity: Not on file  Transportation Needs: Not on file  Physical Activity: Not on file  Stress: Not on file  Social Connections: Not on file   Vitals:   07/10/21 0917 07/10/21 1019  BP: (!) 150/98 (!) 160/90  Pulse: 73   Resp: 16    Temp: 98 F (36.7 C)   SpO2: 94%    Wt Readings from Last 3 Encounters:  07/10/21 188 lb 3.2 oz (85.4 kg)  05/26/21 187 lb 9.6 oz (85.1 kg)  03/03/21 191 lb 12.8 oz (87 kg)   Body mass index is 35.56 kg/m.  Physical Exam Vitals and nursing note reviewed.  Constitutional:      General: She is not in acute distress.    Appearance: She is well-developed. She is not ill-appearing.  HENT:     Head: Normocephalic and atraumatic.  Eyes:     Conjunctiva/sclera: Conjunctivae normal.  Cardiovascular:     Rate and Rhythm: Normal rate and regular rhythm.     Heart sounds: No murmur heard. Pulmonary:     Effort: Pulmonary effort is normal. No respiratory distress.     Breath sounds: Normal breath sounds.  Abdominal:     Palpations: Abdomen is soft. There is no mass.     Tenderness: There is no abdominal tenderness.  Musculoskeletal:     Lumbar back: No tenderness or bony tenderness. Negative right straight leg raise test and negative left straight leg raise test.     Comments: No significant deformity appreciated. Pain elicited with movement on exam table during examination. No local edema or erythema appreciated, no suspicious lesions.  Lymphadenopathy:     Cervical: No cervical adenopathy.  Skin:    General: Skin is warm.     Findings: No erythema or rash.  Neurological:     General: No focal deficit present.     Mental Status: She is alert and oriented to person, place, and time.     Deep Tendon Reflexes:     Reflex Scores:      Patellar reflexes are 2+ on the right side and 2+ on the left side.    Comments: Antalgic gait, not assisted. Able to walk on tip toes and heels.  Psychiatric:     Comments: Well groomed, good eye contact.   ASSESSMENT AND PLAN:  [name] was seen today for back pain.  Diagnoses and all orders for this visit:  Hypertension Re-checked 160/90. She is reporting normal BP's at home, so no changes in Triamterene-HCTZ or Amlodipine  dose. Recommend stopping NSAID's. Prednisone side effects discussed. Continue monitoring BP at home. Clearly instructed about warning signs.  Low back pain with radiation Hx of lumbar radiculopathy that required epidural injections. It has been going on for 2 weeks so recommend Prednisone taper. Tramadol 50 mg bid,mianly at bedtime. Tylenol 500 mg 3-4 times per day prn. Recommend d/c Motrin due to elevated BP. We discussed some side effects of medications.  I spent a total of 32 minutes in both face to face and non face to face activities for this visit on the date of this encounter. During this time history was  obtained and documented, examination was performed, prior labs/imaging reviewed, and assessment/plan discussed.  Return in about 4 weeks (around 08/07/2021) for HTN and back pain with PCP.  Bryton Waight G. [place]az[place], MD  [facility]. [place] office.

## 2021-07-10 ENCOUNTER — Ambulatory Visit (INDEPENDENT_AMBULATORY_CARE_PROVIDER_SITE_OTHER): Payer: BC Managed Care – PPO

## 2021-07-10 ENCOUNTER — Encounter: Payer: Self-pay | Admitting: Family Medicine

## 2021-07-10 ENCOUNTER — Ambulatory Visit (INDEPENDENT_AMBULATORY_CARE_PROVIDER_SITE_OTHER): Payer: BC Managed Care – PPO | Admitting: Family Medicine

## 2021-07-10 VITALS — BP 160/90 | HR 73 | Temp 98.0°F | Resp 16 | Ht 61.0 in | Wt 188.2 lb

## 2021-07-10 DIAGNOSIS — M545 Low back pain, unspecified: Secondary | ICD-10-CM | POA: Diagnosis not present

## 2021-07-10 DIAGNOSIS — I10 Essential (primary) hypertension: Secondary | ICD-10-CM

## 2021-07-10 DIAGNOSIS — M4126 Other idiopathic scoliosis, lumbar region: Secondary | ICD-10-CM | POA: Diagnosis not present

## 2021-07-10 DIAGNOSIS — M5136 Other intervertebral disc degeneration, lumbar region: Secondary | ICD-10-CM | POA: Diagnosis not present

## 2021-07-10 MED ORDER — PREDNISONE 20 MG PO TABS: ORAL_TABLET | ORAL | 0 refills | Status: AC

## 2021-07-10 MED ORDER — TRAMADOL HCL 50 MG PO TABS: 50.0000 mg | ORAL_TABLET | Freq: Two times a day (BID) | ORAL | 0 refills | Status: AC | PRN

## 2021-07-10 NOTE — Patient Instructions (Addendum)
A few things to remember from today's visit:  Low back pain with radiation - Plan: DG Lumbar Spine Complete, traMADol (ULTRAM) 50 MG tablet, predniSONE (DELTASONE) 20 MG tablet  Primary hypertension  If you need refills please call your pharmacy. Do not use My Chart to request refills or for acute issues that need immediate attention.   Stop Motrin . Tylenol 500 mg 3-4 times per day as needed. Tramadol mainly at bedtime. Prednisone with food. Monitor blood pressure.  Please be sure medication list is accurate. If a new problem present, please set up appointment sooner than planned today.

## 2021-07-10 NOTE — Assessment & Plan Note (Signed)
Hx of lumbar radiculopathy that required epidural injections. It has been going on for 2 weeks so recommend Prednisone taper. Tramadol 50 mg bid,mianly at bedtime. Tylenol 500 mg 3-4 times per day prn. Recommend d/c Motrin due to elevated BP. We discussed some side effects of medications.

## 2021-07-10 NOTE — Assessment & Plan Note (Signed)
Re-checked 160/90. She is reporting normal BP's at home, so no changes in Triamterene-HCTZ or Amlodipine dose. Recommend stopping NSAID's. Prednisone side effects discussed. Continue monitoring BP at home. Clearly instructed about warning signs.

## 2021-07-25 DIAGNOSIS — M5431 Sciatica, right side: Secondary | ICD-10-CM | POA: Diagnosis not present

## 2021-07-27 DIAGNOSIS — I739 Peripheral vascular disease, unspecified: Secondary | ICD-10-CM | POA: Diagnosis not present

## 2021-07-27 DIAGNOSIS — M5416 Radiculopathy, lumbar region: Secondary | ICD-10-CM | POA: Diagnosis not present

## 2021-07-28 DIAGNOSIS — M545 Low back pain, unspecified: Secondary | ICD-10-CM | POA: Diagnosis not present

## 2021-07-28 DIAGNOSIS — M25561 Pain in right knee: Secondary | ICD-10-CM | POA: Diagnosis not present

## 2021-07-28 NOTE — Progress Notes (Unsigned)
ACUTE VISIT Chief Complaint  Patient presents with   Sciatica    Seen at urgent care x 2, given steroids, anti-inflammatory and pain medicine. Pain is still present on the right side, unable to sleep at night.    HPI: [name] is a 66 y.o. female with hx of back pain,HTN,and hypothyroidism here today complaining of right lower back pain radiated to posterior aspect of thigh down to ankle and foot. Numbness and tingling, cannot feel great toe. Evaluated in acute care on 07/25/21 and 07/27/21. Diclofenac 75 mg bid and Methylprednisolone taper given on 07/25/21. Rx for Hydrocodone-Acetaminophen given on 07/27/21, not helping much.  States that Oxycodone has helped with pain in the past. She has right cold feet sensation. According to pt, provider in urgent care provider did find a pulse. Right calf pain with ambulation since back pain started. She has not noted cyanosis or LE ulcers.  Negative for fever, abnormal wt loss, urinary symptoms,changes in bowel habits,saddle anesthesia, or bowel/bladder dysfunction. 07/27/21 lumbar X ray: Advanced degenerative changes of the lumbar spine.   I saw her on 07/10/21. Recommend Prednisone taper and Tramadol. Tramadol did not help  Frustrated because she cannot sleep due to pain, it wakes her up,and "cannot function" during the day. She has not been able to work.  She recently saw her ortho to follow on knee OA and also addressed back pain. She has been given a 6 weeks work excuse and PT is being arranged. She is supposed to follow with ortho after completing PT.  Her BP has been mildly elevated at home, she attributes it to pain. She is on Amlodipine 5 mg daily. She is not taking Triamterene-HCTZ. Negative for severe/frequent headache, visual changes, chest pain, dyspnea, palpitation, focal weakness, or edema.  Review of Systems  Constitutional:  Positive for activity change and fatigue.  Respiratory:  Negative for cough  and wheezing.   Gastrointestinal:  Negative for abdominal pain, nausea and vomiting.  Skin:  Negative for rash.  Neurological:  Negative for syncope and weakness.  Psychiatric/Behavioral:  Positive for sleep disturbance. Negative for confusion. The patient is nervous/anxious.   Rest see pertinent positives and negatives per HPI.  Current Outpatient Medications on File Prior to Visit  Medication Sig Dispense Refill   buPROPion (WELLBUTRIN SR) 150 MG 12 hr tablet Take 1 tablet by mouth daily for 7 days, stop smoking on day 8, then increase to BID on day 8 60 tablet 2   levothyroxine (SYNTHROID) 175 MCG tablet Take 1 tablet (175 mcg total) by mouth daily. 90 tablet 1   Omega-3 Fatty Acids (FISH OIL PO) Take by mouth.     No current facility-administered medications on file prior to visit.   Past Medical History:  Diagnosis Date   Abnormal Pap smear of cervix    02-26-20   Hypertension    Osteoarthritis, knee    Thyroid goiter    No Known Allergies  Social History   Socioeconomic History   Marital status: Single    Spouse name: Not on file   Number of children: Not on file   Years of education: Not on file   Highest education level: Not on file  Occupational History   Not on file  Tobacco Use   Smoking status: Every Day    Packs/day: 0.50    Years: 42.00    Total pack years: 21.00    Types: Cigarettes    Last attempt to quit: 06/21/2019    Years  since quitting: 2.1   Smokeless tobacco: Never  Substance and Sexual Activity   Alcohol use: Yes    Comment: socially   Drug use: Never   Sexual activity: Not Currently    Partners: Male    Birth control/protection: Post-menopausal  Other Topics Concern   Not on file  Social History Narrative   Not on file   Social Determinants of Health   Financial Resource Strain: Not on file  Food Insecurity: Not on file  Transportation Needs: Not on file  Physical Activity: Not on file  Stress: Not on file  Social Connections: Not  on file   Vitals:   07/31/21 0918  BP: 130/70  Pulse: 82  Resp: 16  SpO2: 97%   Body mass index is 35.59 kg/m.  Physical Exam Vitals and nursing note reviewed.  Constitutional:      General: She is not in acute distress.    Appearance: She is well-developed. She is not ill-appearing.  HENT:     Head: Normocephalic and atraumatic.  Eyes:     Conjunctiva/sclera: Conjunctivae normal.  Cardiovascular:     Rate and Rhythm: Normal rate and regular rhythm.     Heart sounds: No murmur heard.    Comments: DP and PT present bilateral. Pulmonary:     Effort: Pulmonary effort is normal. No respiratory distress.     Breath sounds: Normal breath sounds.  Abdominal:     Palpations: Abdomen is soft.     Tenderness: There is no abdominal tenderness.  Musculoskeletal:     Lumbar back: Tenderness present. No bony tenderness.       Back:     Comments: No significant pain elicited with movement on exam table during examination.Able to bent to tie shoes.  Skin:    General: Skin is warm.     Findings: No erythema or rash.  Neurological:     General: No focal deficit present.     Mental Status: She is alert and oriented to person, place, and time.     Comments: Antalgic gait assisted by a cane.  Psychiatric:        Mood and Affect: Mood is anxious.     Comments: Well groomed, good eye contact.   ASSESSMENT AND PLAN:  Ms.Psalm was seen today for sciatica.  Diagnoses and all orders for this visit: Orders Placed This Encounter  Procedures   VAS [place] ABI WITH/WO TBI   Pain of right lower extremity Peripheral pulses present. Some calf pain with ambulation, which could be caused by radicular pain. Stressed the importance of smoking cessation. ABI will be arranged.  Right-sided low back pain with right-sided sciatica, unspecified chronicity Educated about Dx, prognosis,and treatment options. She is already following with orthopedist and planning on starting PT. Gabapentin 100 mg  started today, instructed to start at bedtime and titrate to tid as tolerated, increasing dose every 7 days.  Percocet for pain management, intended for short period of time. Some side effects of meds discussed. Fall prevention.  -     gabapentin (NEURONTIN) 100 MG capsule; Take 1 capsule (100 mg total) by mouth 3 (three) times daily. -     oxyCODONE-acetaminophen (PERCOCET/ROXICET) 5-325 MG tablet; Take 1 tablet by mouth 2 (two) times daily as needed for up to 10 days for severe pain.  Essential hypertension BP today adequately controlled. Continue monitoring BP regularly. No changes in Amlodipine dose.  -     amLODipine (NORVASC) 5 MG tablet; Take 1 tablet (5 mg total)  by mouth daily.  I spent a total of 35 minutes in both face to face and non face to face activities for this visit on the date of this encounter. During this time history was obtained and documented, examination was performed, and assessment/plan discussed.  Return in about 4 weeks (around 08/28/2021) for HTN and pain.  Kalyiah Saintil G. [place], MD  [facility] [place] office.

## 2021-07-31 ENCOUNTER — Encounter: Payer: Self-pay | Admitting: Family Medicine

## 2021-07-31 ENCOUNTER — Ambulatory Visit (INDEPENDENT_AMBULATORY_CARE_PROVIDER_SITE_OTHER): Payer: BC Managed Care – PPO | Admitting: Family Medicine

## 2021-07-31 ENCOUNTER — Ambulatory Visit: Payer: BC Managed Care – PPO | Admitting: Family Medicine

## 2021-07-31 VITALS — BP 130/70 | HR 82 | Resp 16 | Ht 61.0 in | Wt 188.4 lb

## 2021-07-31 DIAGNOSIS — M5441 Lumbago with sciatica, right side: Secondary | ICD-10-CM

## 2021-07-31 DIAGNOSIS — I10 Essential (primary) hypertension: Secondary | ICD-10-CM | POA: Diagnosis not present

## 2021-07-31 DIAGNOSIS — M79604 Pain in right leg: Secondary | ICD-10-CM | POA: Diagnosis not present

## 2021-07-31 MED ORDER — AMLODIPINE BESYLATE 5 MG PO TABS: 5.0000 mg | ORAL_TABLET | Freq: Every day | ORAL | 1 refills | Status: DC

## 2021-07-31 MED ORDER — GABAPENTIN 100 MG PO CAPS: 100.0000 mg | ORAL_CAPSULE | Freq: Three times a day (TID) | ORAL | 0 refills | Status: DC

## 2021-07-31 MED ORDER — OXYCODONE-ACETAMINOPHEN 5-325 MG PO TABS: 1.0000 | ORAL_TABLET | Freq: Two times a day (BID) | ORAL | 0 refills | Status: AC | PRN

## 2021-07-31 NOTE — Patient Instructions (Addendum)
A few things to remember from today's visit:  Pain of right lower extremity - Plan: VAS [place] ABI WITH/WO TBI  Right-sided low back pain with right-sided sciatica, unspecified chronicity - Plan: gabapentin (NEURONTIN) 100 MG capsule  Essential hypertension - Plan: amLODipine (NORVASC) 5 MG tablet  If you need refills please call your pharmacy. Do not use My Chart to request refills or for acute issues that need immediate attention.   Gabapentin started today, start at bedtime and in 7 days add another one in the morning, 7 days later add another one at noon. Continue following with ortho and complete PT as recommended.  Continue monitoring blood pressure at home and bring numbers next visit.  Please be sure medication list is accurate. If a new problem present, please set up appointment sooner than planned today. Radicular Pain Radicular pain is a type of pain that spreads from your back or neck along a spinal nerve. Spinal nerves are nerves that leave the spinal cord and go to the muscles. Radicular pain is sometimes called radiculopathy, radiculitis, or a pinched nerve. When you have this type of pain, you may also have weakness, numbness, or tingling in the area of your body that is supplied by the nerve. The pain may feel sharp and burning. Depending on which spinal nerve is affected, the pain may occur in the: Neck area (cervical radicular pain). You may also feel pain, numbness, weakness, or tingling in the arms. Mid-spine area (thoracic radicular pain). You would feel this pain in the back and chest. This type is rare. Lower back area (lumbar radicular pain). You would feel this pain as low back pain. You may feel pain, numbness, weakness, or tingling in the buttocks or legs. Sciatica is a type of lumbar radicular pain that shoots down the back of the leg. Radicular pain occurs when one of the spinal nerves becomes irritated or squeezed (compressed). It is often caused by something pushing  on a spinal nerve, such as one of the bones of the spine (vertebrae) or one of the round cushions between vertebrae (intervertebral disks). This can result from: An injury. Wear and tear or aging of a disk. The growth of a bone spur that pushes on the nerve. Radicular pain often goes away when you follow instructions from your health care provider for relieving pain at home. How is this treated? Treatment may depend on the cause of the condition and may include: Working with a physical therapist. Taking pain medicine. Applying heat or ice or both to the affected areas. Doing stretches to improve flexibility. Having surgery. This may be needed if other treatments do not help. Different types of surgery may be done depending on the cause of this condition. Follow these instructions at home: Managing pain     If directed, put ice on the affected area. To do this: Put ice in a plastic bag. Place a towel between your skin and the bag. Leave the ice on for 20 minutes, 2-3 times a day. Remove the ice if your skin turns bright red. This is very important. If you cannot feel pain, heat, or cold, you have a greater risk of damage to the area. If directed, apply heat to the affected area as often as told by your health care provider. Use the heat source that your health care provider recommends, such as a moist heat pack or a heating pad. Place a towel between your skin and the heat source. Leave the heat on for 20-30  minutes. Remove the heat if your skin turns bright red. This is especially important if you are unable to feel pain, heat, or cold. You have a greater risk of getting burned. Activity Do not sit or rest in bed for long periods of time. Try to stay as active as possible. Ask your health care provider what type of exercise or activity is best for you. Avoid activities that make your pain worse, such as bending and lifting. You may have to avoid lifting. Ask your health care provider  how much you can safely lift. Practice using proper technique when lifting items. Proper lifting technique involves bending your knees and rising up. Do strength and range-of-motion exercises only as told by your health care provider or physical therapist. General instructions Take over-the-counter and prescription medicines only as told by your health care provider. Pay attention to any changes in your symptoms. Keep all follow-up visits. This is important. Contact a health care provider if: Your pain and other symptoms get worse. Your pain medicine is not helping. Your pain has not improved after a few weeks of home care. You have a fever. Get help right away if: You have severe pain, weakness, or numbness. You have difficulty with bladder or bowel control. Summary Radicular pain is a type of pain that spreads from your back or neck along a spinal nerve. When you have radicular pain, you may also have weakness, numbness, or tingling in the area of your body that is supplied by the nerve. The pain may feel sharp or burning. Radicular pain may be treated with ice, heat, medicines, or physical therapy. This information is not intended to replace advice given to you by your health care provider. Make sure you discuss any questions you have with your health care provider. Document Revised: 07/21/2020 Document Reviewed: 07/21/2020 Elsevier Patient Education  2023 [employer].

## 2021-08-02 DIAGNOSIS — M5116 Intervertebral disc disorders with radiculopathy, lumbar region: Secondary | ICD-10-CM | POA: Diagnosis not present

## 2021-08-02 DIAGNOSIS — M9953 Intervertebral disc stenosis of neural canal of lumbar region: Secondary | ICD-10-CM | POA: Diagnosis not present

## 2021-08-03 ENCOUNTER — Ambulatory Visit (HOSPITAL_COMMUNITY)
Admission: RE | Admit: 2021-08-03 | Discharge: 2021-08-03 | Disposition: A | Discharge status: Home / Self Care | Payer: BC Managed Care – PPO | Source: Ambulatory Visit | Attending: Family Medicine | Admitting: Family Medicine

## 2021-08-03 DIAGNOSIS — M79604 Pain in right leg: Secondary | ICD-10-CM | POA: Insufficient documentation

## 2021-08-08 DIAGNOSIS — M5116 Intervertebral disc disorders with radiculopathy, lumbar region: Secondary | ICD-10-CM | POA: Diagnosis not present

## 2021-08-08 DIAGNOSIS — M9953 Intervertebral disc stenosis of neural canal of lumbar region: Secondary | ICD-10-CM | POA: Diagnosis not present

## 2021-08-10 DIAGNOSIS — M9953 Intervertebral disc stenosis of neural canal of lumbar region: Secondary | ICD-10-CM | POA: Diagnosis not present

## 2021-08-10 DIAGNOSIS — M5116 Intervertebral disc disorders with radiculopathy, lumbar region: Secondary | ICD-10-CM | POA: Diagnosis not present

## 2021-08-11 DIAGNOSIS — J069 Acute upper respiratory infection, unspecified: Secondary | ICD-10-CM | POA: Diagnosis not present

## 2021-08-14 DIAGNOSIS — M5116 Intervertebral disc disorders with radiculopathy, lumbar region: Secondary | ICD-10-CM | POA: Diagnosis not present

## 2021-08-14 DIAGNOSIS — M9953 Intervertebral disc stenosis of neural canal of lumbar region: Secondary | ICD-10-CM | POA: Diagnosis not present

## 2021-08-16 DIAGNOSIS — S335XXD Sprain of ligaments of lumbar spine, subsequent encounter: Secondary | ICD-10-CM | POA: Diagnosis not present

## 2021-08-16 DIAGNOSIS — M9953 Intervertebral disc stenosis of neural canal of lumbar region: Secondary | ICD-10-CM | POA: Diagnosis not present

## 2021-08-16 DIAGNOSIS — M5116 Intervertebral disc disorders with radiculopathy, lumbar region: Secondary | ICD-10-CM | POA: Diagnosis not present

## 2021-08-16 NOTE — Progress Notes (Unsigned)
HPI: Anna Pratt is a 66 y.o. female, who is here today to establish care.  Former PCP: Dr. Hassan Rowan Last preventive routine visit: 03/03/21.  Chronic medical problems: Chronic back pain,hypothyroidism,and HTN among some.  Hypertension on Amlodipine 5 mg daily. BP readings at home:130's/80. Side effects:No Negative for unusual or severe headache, visual changes, exertional chest pain, dyspnea,  focal weakness, or edema.  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 She is on Levothyroxine 175 mcg daily.  Lab Results  Component Value Date   TSH 0.36 05/08/2021   TSH repeated on 05/17/21 and normal at 1.94.  Seen on 07/31/21 for worsening lower back pain radiated to RLE. Lower back pain has improved, still radiates to RLE. She was started on Gabapentin 100 mg tid, which she has tolerated well. Pain now 3-4/10, intermittent. Worse at night when lying in bed. She thinks she needs another month off from work. She is following with ortho. Lumbar MRI in 2011. Taking Advil 200 mg daily as needed.  + Smoker. She was given Wellbutrin by former PCP, she is taking it some days.   Review of Systems  Constitutional:  Negative for activity change, appetite change and fever.  HENT:  Negative for mouth sores, nosebleeds and sore throat.   Respiratory:  Negative for cough and wheezing.   Gastrointestinal:  Negative for abdominal pain, nausea and vomiting.       Negative for changes in bowel habits.  Genitourinary:  Negative for decreased urine volume, dysuria and hematuria.  Skin:  Negative for rash.  Neurological:  Negative for syncope, facial asymmetry and weakness.  Rest see pertinent positives and negatives per HPI.  Current Outpatient Medications on File Prior to  Visit  Medication Sig Dispense Refill   amLODipine (NORVASC) 5 MG tablet Take 1 tablet (5 mg total) by mouth daily. 90 tablet 1   levothyroxine (SYNTHROID) 175 MCG tablet Take 1 tablet (175 mcg total) by mouth daily. 90 tablet 1   Omega-3 Fatty Acids (FISH OIL PO) Take by mouth.     No current facility-administered medications on file prior to visit.   Past Medical History:  Diagnosis Date   Abnormal Pap smear of cervix    02-26-20   Hypertension    Osteoarthritis, knee    Thyroid goiter    No Known Allergies  Family History  Problem Relation Age of Onset   Hypertension Father    Heart attack Father    Bone cancer Sister 43   Diabetes Maternal Grandmother    Social History   Socioeconomic History   Marital status: Single    Spouse name: Not on file   Number of children: Not on file   Years of education: Not on file   Highest education level: Not on file  Occupational History   Not on file  Tobacco Use   Smoking status: Every Day    Packs/day: 0.50    Years: 42.00    Total pack years: 21.00    Types: Cigarettes    Last attempt to quit: 06/21/2019    Years since quitting: 2.1   Smokeless tobacco: Never  Substance and Sexual Activity   Alcohol use: Yes    Comment: socially   Drug use: Never  Sexual activity: Not Currently    Partners: Male    Birth control/protection: Post-menopausal  Other Topics Concern   Not on file  Social History Narrative   Not on file   Social Determinants of Health   Financial Resource Strain: Not on file  Food Insecurity: Not on file  Transportation Needs: Not on file  Physical Activity: Not on file  Stress: Not on file  Social Connections: Not on file   Vitals:   08/18/21 0822  BP: 128/80  Pulse: 64  Resp: 12  SpO2: 97%   Body mass index is 35.99 kg/m.  Physical Exam Vitals and nursing note reviewed.  Constitutional:      General: She is not in acute distress.    Appearance: She is well-developed.  HENT:     Head:  Normocephalic and atraumatic.     Mouth/Throat:     Mouth: Mucous membranes are moist.     Pharynx: Oropharynx is clear.  Eyes:     Conjunctiva/sclera: Conjunctivae normal.  Cardiovascular:     Rate and Rhythm: Normal rate and regular rhythm.     Pulses:          Dorsalis pedis pulses are 2+ on the right side and 2+ on the left side.     Heart sounds: Murmur (SEM I/VI RUSB) heard.  Pulmonary:     Effort: Pulmonary effort is normal. No respiratory distress.     Breath sounds: Normal breath sounds.  Abdominal:     Palpations: Abdomen is soft. There is no hepatomegaly or mass.     Tenderness: There is no abdominal tenderness.  Musculoskeletal:     Lumbar back: Spasms present. No tenderness or bony tenderness.  Lymphadenopathy:     Cervical: No cervical adenopathy.  Skin:    General: Skin is warm.     Findings: No erythema or rash.  Neurological:     General: No focal deficit present.     Mental Status: She is alert and oriented to person, place, and time.     Cranial Nerves: No cranial nerve deficit.     Gait: Gait normal.  Psychiatric:     Comments: Well groomed, good eye contact.   ASSESSMENT AND PLAN:  [name] was seen today for establish care.  Diagnoses and all orders for this visit:  Right-sided low back pain with right-sided sciatica, unspecified chronicity Problem has improved. Continue Gabapentin 100 mg tid. Continue PT. Following with ortho.  -     gabapentin (NEURONTIN) 100 MG capsule; Take 1 capsule (100 mg total) by mouth 3 (three) times daily.  Post-surgical hypothyroidism Problem is well controlled. Continue Levothyroxine 175 mcg daily.  Essential (primary) hypertension BP adequately controlled. Continue current management: Amlodipine 5 mg daily. DASH/low salt diet to continue. Continue monitoring BP at home.  Tobacco abuse Encouraged smoking cessation. Explained Wellbutrin treatment is daily for 11 weeks. Other pharmacologic options  discussed, nicotine patches or gum.  -     buPROPion (WELLBUTRIN SR) 150 MG 12 hr tablet; Take 1 tablet by mouth daily for 7 days, stop smoking on day 8, then increase to BID on day 8  Return in about 3 months (around 11/30/2021).  Shawn Carattini G. [place], MD  [facility]. [place] office.

## 2021-08-18 ENCOUNTER — Encounter: Payer: Self-pay | Admitting: Family Medicine

## 2021-08-18 ENCOUNTER — Ambulatory Visit (INDEPENDENT_AMBULATORY_CARE_PROVIDER_SITE_OTHER): Payer: BC Managed Care – PPO | Admitting: Family Medicine

## 2021-08-18 VITALS — BP 128/80 | HR 64 | Resp 12 | Ht 61.0 in | Wt 190.5 lb

## 2021-08-18 DIAGNOSIS — M5441 Lumbago with sciatica, right side: Secondary | ICD-10-CM

## 2021-08-18 DIAGNOSIS — I10 Essential (primary) hypertension: Secondary | ICD-10-CM | POA: Diagnosis not present

## 2021-08-18 DIAGNOSIS — M545 Low back pain, unspecified: Secondary | ICD-10-CM

## 2021-08-18 DIAGNOSIS — Z720 Tobacco use: Secondary | ICD-10-CM | POA: Diagnosis not present

## 2021-08-18 DIAGNOSIS — E890 Postprocedural hypothyroidism: Secondary | ICD-10-CM

## 2021-08-18 MED ORDER — GABAPENTIN 100 MG PO CAPS: 100.0000 mg | ORAL_CAPSULE | Freq: Three times a day (TID) | ORAL | 2 refills | Status: DC

## 2021-08-18 MED ORDER — BUPROPION HCL ER (SR) 150 MG PO TB12: ORAL_TABLET | ORAL | 2 refills | Status: DC

## 2021-08-18 NOTE — Patient Instructions (Addendum)
A few things to remember from today's visit:  Low back pain with radiation  Essential hypertension  Post-surgical hypothyroidism  Essential (primary) hypertension  If you need refills please call your pharmacy. Do not use My Chart to request refills or for acute issues that need immediate attention.  Calcium 600 mg 2 times daily through diet or tabs. Vit 800 U daily.  No changes today. Continue following with ortho for back pain.  Nicotine patches or gum are other options for smoking cessation.  Please be sure medication list is accurate. If a new problem present, please set up appointment sooner than planned today.

## 2021-08-23 DIAGNOSIS — M9953 Intervertebral disc stenosis of neural canal of lumbar region: Secondary | ICD-10-CM | POA: Diagnosis not present

## 2021-08-23 DIAGNOSIS — M5116 Intervertebral disc disorders with radiculopathy, lumbar region: Secondary | ICD-10-CM | POA: Diagnosis not present

## 2021-08-25 DIAGNOSIS — M9953 Intervertebral disc stenosis of neural canal of lumbar region: Secondary | ICD-10-CM | POA: Diagnosis not present

## 2021-08-25 DIAGNOSIS — M5116 Intervertebral disc disorders with radiculopathy, lumbar region: Secondary | ICD-10-CM | POA: Diagnosis not present

## 2021-08-29 DIAGNOSIS — M9953 Intervertebral disc stenosis of neural canal of lumbar region: Secondary | ICD-10-CM | POA: Diagnosis not present

## 2021-08-29 DIAGNOSIS — M5116 Intervertebral disc disorders with radiculopathy, lumbar region: Secondary | ICD-10-CM | POA: Diagnosis not present

## 2021-08-31 DIAGNOSIS — M9953 Intervertebral disc stenosis of neural canal of lumbar region: Secondary | ICD-10-CM | POA: Diagnosis not present

## 2021-08-31 DIAGNOSIS — M5116 Intervertebral disc disorders with radiculopathy, lumbar region: Secondary | ICD-10-CM | POA: Diagnosis not present

## 2021-09-05 DIAGNOSIS — M5116 Intervertebral disc disorders with radiculopathy, lumbar region: Secondary | ICD-10-CM | POA: Diagnosis not present

## 2021-09-05 DIAGNOSIS — M9953 Intervertebral disc stenosis of neural canal of lumbar region: Secondary | ICD-10-CM | POA: Diagnosis not present

## 2021-09-08 DIAGNOSIS — M25571 Pain in right ankle and joints of right foot: Secondary | ICD-10-CM | POA: Diagnosis not present

## 2021-10-04 ENCOUNTER — Other Ambulatory Visit: Payer: Self-pay | Admitting: *Deleted

## 2021-10-04 MED ORDER — LEVOTHYROXINE SODIUM 175 MCG PO TABS: 175.0000 ug | ORAL_TABLET | Freq: Every day | ORAL | 1 refills | Status: DC

## 2021-11-22 ENCOUNTER — Other Ambulatory Visit: Payer: Self-pay | Admitting: Family Medicine

## 2021-11-22 DIAGNOSIS — M5441 Lumbago with sciatica, right side: Secondary | ICD-10-CM

## 2021-11-28 NOTE — Progress Notes (Signed)
HPI: [name] is a 66 y.o. female, who is here today for follow up. She was seen on 08/18/21 for back pain with RLE radiation.  Back pain with radiation, which has since improved with the initiation of gabapentin 100 mg three times per day. She continues to see the orthopedist as needed.  Hypothyroidism: She is taking levothyroxine 175 mcg daily. Her last thyroid check was in April, and the results were normal. She would like to have thyroid check today, along with hemoglobin A1c and fasting glucose tests.  TSH 1.9 in 04/2021.  She reports a non-pruritic rash on both arms,she has had problem for years. On dorsal aspect of proximal forearm, under elbows. She has not identified exacerbating or alleviating factors. She uses OTC moisturizers. It has been stable.  HTN on amlodipine 5 mg daily.  She does not check BP regularly. Negative for unusual or severe headache, visual changes, exertional chest pain, dyspnea,  focal weakness, or edema.  Lab Results  Component Value Date   CREATININE 0.81 05/26/2021   BUN 17 05/26/2021   NA 139 05/26/2021   K 3.6 05/26/2021   CL 103 05/26/2021   CO2 31 05/26/2021  She received a flu shot a couple of months ago and had her last eye exam earlier this year. Former smoker, started at age 55, 1/2 PPD in average and for a total of 42 years. She exercises at the gym twice a week.  Review of Systems  Constitutional:  Negative for activity change, appetite change and fever.  HENT:  Negative for mouth sores, nosebleeds and trouble swallowing.   Respiratory:  Negative for cough and wheezing.   Gastrointestinal:  Negative for abdominal pain, nausea and vomiting.       Negative for changes in bowel habits.  Genitourinary:  Negative for decreased urine volume, difficulty urinating, dysuria and hematuria.  Skin:  Negative for rash.  Neurological:  Negative for syncope and facial asymmetry.  Rest see pertinent positives and negatives per  HPI.  Current Outpatient Medications on File Prior to Visit  Medication Sig Dispense Refill   amLODipine (NORVASC) 5 MG tablet Take 1 tablet (5 mg total) by mouth daily. 90 tablet 1   gabapentin (NEURONTIN) 100 MG capsule TAKE 1 CAPSULE BY MOUTH THREE TIMES DAILY 90 capsule 2   levothyroxine (SYNTHROID) 175 MCG tablet Take 1 tablet (175 mcg total) by mouth daily. 90 tablet 1   Omega-3 Fatty Acids (FISH OIL PO) Take by mouth.     No current facility-administered medications on file prior to visit.   Past Medical History:  Diagnosis Date   Abnormal Pap smear of cervix    02-26-20   Hypertension    Osteoarthritis, knee    Thyroid goiter    No Known Allergies  Social History   Socioeconomic History   Marital status: Single    Spouse name: Not on file   Number of children: Not on file   Years of education: Not on file   Highest education level: Not on file  Occupational History   Not on file  Tobacco Use   Smoking status: Every Day    Packs/day: 0.50    Years: 42.00    Total pack years: 21.00    Types: Cigarettes    Start date: 49    Last attempt to quit: 06/21/2019    Years since quitting: 2.4   Smokeless tobacco: Never  Substance and Sexual Activity   Alcohol use: Yes    Comment: socially  Drug use: Never   Sexual activity: Not Currently    Partners: Male    Birth control/protection: Post-menopausal  Other Topics Concern   Not on file  Social History Narrative   Not on file   Social Determinants of Health   Financial Resource Strain: Not on file  Food Insecurity: Not on file  Transportation Needs: Not on file  Physical Activity: Not on file  Stress: Not on file  Social Connections: Not on file   Vitals:   11/29/21 0854  BP: 130/80  Pulse: 80  Resp: 16  Temp: 98 F (36.7 C)  SpO2: 98%   Body mass index is 36.33 kg/m. Physical Exam Vitals and nursing note reviewed.  Constitutional:      General: She is not in acute distress.    Appearance: She  is well-developed.  HENT:     Head: Normocephalic and atraumatic.     Mouth/Throat:     Mouth: Mucous membranes are moist.     Pharynx: Oropharynx is clear.  Eyes:     Conjunctiva/sclera: Conjunctivae normal.  Cardiovascular:     Rate and Rhythm: Normal rate and regular rhythm.     Pulses:          Dorsalis pedis pulses are 2+ on the right side and 2+ on the left side.     Heart sounds: Murmur (SEM I/VI RUSB) heard.  Pulmonary:     Effort: Pulmonary effort is normal. No respiratory distress.     Breath sounds: Normal breath sounds.  Abdominal:     Palpations: Abdomen is soft. There is no hepatomegaly or mass.     Tenderness: There is no abdominal tenderness.  Musculoskeletal:     Right lower leg: 1+ Pitting Edema present.     Left lower leg: 1+ Pitting Edema present.  Lymphadenopathy:     Cervical: No cervical adenopathy.  Skin:    General: Skin is warm.     Findings: No erythema or rash.  Neurological:     General: No focal deficit present.     Mental Status: She is alert and oriented to person, place, and time.     Cranial Nerves: No cranial nerve deficit.     Gait: Gait normal.  Psychiatric:     Comments: Well groomed, good eye contact.   ASSESSMENT AND PLAN: [name] was seen today for follow-up.  Diagnoses and all orders for this visit: Orders Placed This Encounter  Procedures   Basic metabolic panel   TSH   Hemoglobin A1c   Ambulatory Referral for Lung Cancer Scre   Lab Results  Component Value Date   HGBA1C 6.1 11/29/2021   Lab Results  Component Value Date   TSH 0.78 11/29/2021   Lab Results  Component Value Date   CREATININE 0.70 11/29/2021   BUN 18 11/29/2021   NA 142 11/29/2021   K 3.5 11/29/2021   CL 105 11/29/2021   CO2 30 11/29/2021   Low back pain with radiation Problem has improved with Gabapentin, continue 100 mg tid. Follows with ortho.  Prediabetes Consistency with a healthy life style encouraged for diabetes prevention HgA1C  added to labs today.  Post-surgical hypothyroidism Last TSH on 05/17/2021 1.9. Continue Levothyroxine 175 mcg daily. She would like TSH rechecked today, further recommendations will be given according to lab results.  Essential (primary) hypertension BP adequately controlled. Continue current management: Amlodipine 5 mg daily. DASH/low salt diet to continue. Monitor BP at home. Eye exam up to date.  Prediabetes Consistency  with a healthy life style encouraged for diabetes prevention. Further recommendations according to HgA1C result.  Return in about 6 months (around 05/30/2022).  Terryl Niziolek G. Martinique, MD  [facility]. [place] office.

## 2021-11-29 ENCOUNTER — Ambulatory Visit (INDEPENDENT_AMBULATORY_CARE_PROVIDER_SITE_OTHER): Payer: BC Managed Care – PPO | Admitting: Family Medicine

## 2021-11-29 ENCOUNTER — Encounter: Payer: Self-pay | Admitting: Family Medicine

## 2021-11-29 VITALS — BP 130/80 | HR 80 | Temp 98.0°F | Resp 16 | Ht 61.0 in | Wt 192.2 lb

## 2021-11-29 DIAGNOSIS — M545 Low back pain, unspecified: Secondary | ICD-10-CM

## 2021-11-29 DIAGNOSIS — E039 Hypothyroidism, unspecified: Secondary | ICD-10-CM

## 2021-11-29 DIAGNOSIS — Z122 Encounter for screening for malignant neoplasm of respiratory organs: Secondary | ICD-10-CM

## 2021-11-29 DIAGNOSIS — E890 Postprocedural hypothyroidism: Secondary | ICD-10-CM | POA: Diagnosis not present

## 2021-11-29 DIAGNOSIS — R7303 Prediabetes: Secondary | ICD-10-CM | POA: Diagnosis not present

## 2021-11-29 DIAGNOSIS — I10 Essential (primary) hypertension: Secondary | ICD-10-CM | POA: Diagnosis not present

## 2021-11-29 LAB — BASIC METABOLIC PANEL
BUN: 18 mg/dL (ref 6–23)
CO2: 30 mEq/L (ref 19–32)
Calcium: 9.2 mg/dL (ref 8.4–10.5)
Chloride: 105 mEq/L (ref 96–112)
Creatinine, Ser: 0.7 mg/dL (ref 0.40–1.20)
GFR: 89.9 mL/min (ref >60.00)
Glucose, Bld: 83 mg/dL (ref 70–99)
Potassium: 3.5 mEq/L (ref 3.5–5.1)
Sodium: 142 mEq/L (ref 135–145)

## 2021-11-29 LAB — HEMOGLOBIN A1C: Hgb A1c MFr Bld: 6.1 % (ref 4.6–6.5)

## 2021-11-29 LAB — TSH: TSH: 0.78 u[IU]/mL (ref 0.35–5.50)

## 2021-11-29 NOTE — Patient Instructions (Addendum)
A few things to remember from today's visit:   Screening for lung cancer - Plan: Ambulatory Referral for Lung Cancer Scre  Essential (primary) hypertension - Plan: Basic metabolic panel  Low back pain with radiation  Essential hypertension  Hypothyroidism, unspecified type - Plan: TSH  Prediabetes - Plan: Hemoglobin A1c  No changes today. Lung cancer screening will be arranged.  If you need refills for medications you take chronically, please call your pharmacy. Do not use My Chart to request refills or for acute issues that need immediate attention. If you send a my chart message, it may take a few days to be addressed, specially if I am not in the office.  Please be sure medication list is accurate. If a new problem present, please set up appointment sooner than planned today.

## 2021-11-29 NOTE — Assessment & Plan Note (Signed)
BP adequately controlled. Continue current management: Amlodipine 5 mg daily. DASH/low salt diet to continue. Monitor BP at home. Eye exam up to date.

## 2021-11-29 NOTE — Assessment & Plan Note (Signed)
Consistency with a healthy life style encouraged for diabetes prevention. Further recommendations according to HgA1C result. 

## 2021-11-29 NOTE — Assessment & Plan Note (Signed)
Last TSH on 05/17/2021 1.9. Continue Levothyroxine 175 mcg daily. She would like TSH rechecked today, further recommendations will be given according to lab results.

## 2022-01-21 ENCOUNTER — Other Ambulatory Visit: Payer: Self-pay | Admitting: Family Medicine

## 2022-01-21 DIAGNOSIS — I10 Essential (primary) hypertension: Secondary | ICD-10-CM

## 2022-04-07 ENCOUNTER — Other Ambulatory Visit: Payer: Self-pay | Admitting: Family Medicine

## 2022-04-20 ENCOUNTER — Other Ambulatory Visit: Payer: Self-pay | Admitting: Family Medicine

## 2022-04-20 DIAGNOSIS — M5441 Lumbago with sciatica, right side: Secondary | ICD-10-CM

## 2022-07-25 ENCOUNTER — Other Ambulatory Visit: Payer: Self-pay | Admitting: Family Medicine

## 2022-07-25 DIAGNOSIS — I10 Essential (primary) hypertension: Secondary | ICD-10-CM

## 2022-08-07 ENCOUNTER — Encounter: Payer: Self-pay | Admitting: Family

## 2022-08-07 ENCOUNTER — Telehealth: Payer: Self-pay | Admitting: Family

## 2022-08-07 ENCOUNTER — Ambulatory Visit (INDEPENDENT_AMBULATORY_CARE_PROVIDER_SITE_OTHER): Payer: BC Managed Care – PPO | Admitting: Family

## 2022-08-07 VITALS — BP 170/105 | HR 80 | Ht 61.0 in | Wt 189.2 lb

## 2022-08-07 DIAGNOSIS — Z720 Tobacco use: Secondary | ICD-10-CM

## 2022-08-07 DIAGNOSIS — I10 Essential (primary) hypertension: Secondary | ICD-10-CM

## 2022-08-07 DIAGNOSIS — E890 Postprocedural hypothyroidism: Secondary | ICD-10-CM

## 2022-08-07 DIAGNOSIS — R7303 Prediabetes: Secondary | ICD-10-CM

## 2022-08-07 DIAGNOSIS — R739 Hyperglycemia, unspecified: Secondary | ICD-10-CM

## 2022-08-07 DIAGNOSIS — Z780 Asymptomatic menopausal state: Secondary | ICD-10-CM | POA: Diagnosis not present

## 2022-08-07 DIAGNOSIS — Z1231 Encounter for screening mammogram for malignant neoplasm of breast: Secondary | ICD-10-CM

## 2022-08-07 DIAGNOSIS — M5441 Lumbago with sciatica, right side: Secondary | ICD-10-CM

## 2022-08-07 HISTORY — DX: Tobacco use: Z72.0

## 2022-08-07 LAB — COMPREHENSIVE METABOLIC PANEL
ALT: 22 U/L (ref 0–35)
AST: 22 U/L (ref 0–37)
Albumin: 4.5 g/dL (ref 3.5–5.2)
Alkaline Phosphatase: 108 U/L (ref 39–117)
BUN: 20 mg/dL (ref 6–23)
CO2: 29 mEq/L (ref 19–32)
Calcium: 9.7 mg/dL (ref 8.4–10.5)
Chloride: 103 mEq/L (ref 96–112)
Creatinine, Ser: 0.76 mg/dL (ref 0.40–1.20)
GFR: 81.06 mL/min (ref >60.00)
Glucose, Bld: 88 mg/dL (ref 70–99)
Potassium: 3.6 mEq/L (ref 3.5–5.1)
Sodium: 142 mEq/L (ref 135–145)
Total Bilirubin: 0.5 mg/dL (ref 0.2–1.2)
Total Protein: 7.4 g/dL (ref 6.0–8.3)

## 2022-08-07 LAB — HEMOGLOBIN A1C: Hgb A1c MFr Bld: 5.9 % (ref 4.6–6.5)

## 2022-08-07 LAB — TSH: TSH: 1.6 u[IU]/mL (ref 0.35–5.50)

## 2022-08-07 MED ORDER — VARENICLINE TARTRATE (STARTER) 0.5 MG X 11 & 1 MG X 42 PO TBPK: ORAL_TABLET | ORAL | 0 refills | Status: DC

## 2022-08-07 MED ORDER — POTASSIUM CHLORIDE CRYS ER 10 MEQ PO TBCR: 10.0000 meq | EXTENDED_RELEASE_TABLET | Freq: Two times a day (BID) | ORAL | 1 refills | Status: DC

## 2022-08-07 MED ORDER — HYDROCHLOROTHIAZIDE 25 MG PO TABS: 25.0000 mg | ORAL_TABLET | Freq: Every day | ORAL | 1 refills | Status: DC

## 2022-08-07 NOTE — Assessment & Plan Note (Addendum)
Hypertension: Elevated blood pressure at today's visit despite taking Amlodipine 5mg  daily. Patient has a history of weight fluctuations impacting blood pressure control. -Add Hydrochlorothiazide once daily in the morning. She required Kdur 10 mEQ last time she took hydrochlorothiazide.  -Check blood pressure and potassium levels in one week.

## 2022-08-07 NOTE — Assessment & Plan Note (Signed)
Chantix is not covered by insurance, but will see if she can get it cheaper with the good rx coupon. Discussed cessation.

## 2022-08-07 NOTE — Progress Notes (Signed)
Subjective:     Anna Pratt ID: Anna Pratt, female    DOB: [birth year], 67 y.o.   MRN: [id number]  No chief complaint on file.   HPI  Discussed Anna use of AI scribe software for clinical note transcription with Anna Pratt, who gave verbal consent to proceed.  History of Present Illness   Anna Pratt, a 67 year old with a history of hypertension, thyroidectomy for goiter, sciatica, kidney stones, and knee replacement, presents today for transfer of care visit. Previously followed by Dr. [place] at our [place] office. Anna Pratt's primary concern is high blood pressure, which has been managed with amlodipine. Anna Pratt reports that Anna blood pressure was well-controlled when Anna Pratt had lost weight in Anna past, but it increased again when Anna Pratt regained Anna weight and started working two jobs. Anna Pratt does not report any symptoms of high blood pressure such as headaches or dizziness.  Anna Pratt also has a history of thyroidectomy for goiter and is currently on Synthroid. Anna Pratt reports feeling tired at times but feels better when Anna Pratt is active. Anna Pratt had a history of sciatica, which was severe and caused significant pain. Anna sciatica was managed with gabapentin and physical therapy, and Anna Pratt currently takes gabapentin as needed. Anna Pratt also had a history of kidney stones and knee replacement but does not report any current issues related to these conditions.  Anna Pratt is a smoker and has expressed a desire to quit. Anna Pratt had tried Chantix in Anna past but found it to be expensive. Anna Pratt is physically active at work and also goes to Anna gym for exercise. Anna Pratt reports some swelling in Anna legs, which improves with rest.      BP Readings from Last 3 Encounters:  08/07/22 (!) 170/105  11/29/21 130/80  08/18/21 128/80       Health Maintenance Due  Topic Date Due   Lung Cancer Screening  Never done   COVID-19 Vaccine (8 - 2023-24 season) 02/16/2022    MAMMOGRAM  04/09/2022    Past Medical History:  Diagnosis Date   Abnormal Pap smear of cervix    02-26-20   Cataract    Hypertension    Kidney stone    Osteoarthritis, knee    Thyroid goiter    Tobacco abuse 08/07/2022    Past Surgical History:  Procedure Laterality Date   ANKLE ARTHROPLASTY Right 2019   done after injury   ANKLE SURGERY Left    s/p injury   CESAREAN SECTION     KIDNEY STONE SURGERY     KNEE SURGERY Right    THYROID SURGERY     due to goiter 32 years ago    Family History  Problem Relation Age of Onset   Hypertension Father    Heart attack Father    Bone cancer Sister 59   Diabetes Maternal Grandmother     Social History   Socioeconomic History   Marital status: Single    Spouse name: Not on file   Number of children: Not on file   Years of education: Not on file   Highest education level: Not on file  Occupational History   Not on file  Tobacco Use   Smoking status: Every Day    Packs/day: 0.50    Years: 42.00    Additional pack years: 0.00    Total pack years: 21.00    Types: Cigarettes    Start date: 32    Last attempt to quit: 06/21/2019  Years since quitting: 3.1   Smokeless tobacco: Never  Substance and Sexual Activity   Alcohol use: Yes    Comment: socially   Drug use: Never   Sexual activity: Not Currently    Partners: Male    Birth control/protection: Post-menopausal  Other Topics Concern   Not on file  Social History Narrative   Works 3rd shift at [employer] ([employer]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) used to work at [employer]   Here from [place] to be near daughter and grandson   Lives with daughter   Single   Completed 1 year of college and some Scientist, 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     SYNTHROID 175 MCG tablet Take 1 tablet by mouth once daily 90 tablet 3   No facility-administered medications prior to visit.    No Known Allergies  ROS    See HPI Objective:    Physical Exam Constitutional:      General: Anna Pratt is not in acute distress.    Appearance: Normal appearance. Anna Pratt is well-developed.  HENT:     Head: Normocephalic and atraumatic.     Right Ear: External ear normal.     Left Ear: External ear normal.  Eyes:     General: No scleral icterus. Neck:     Thyroid: No thyromegaly.  Cardiovascular:     Rate and Rhythm: Normal rate and regular rhythm.     Heart sounds: Normal heart sounds. No murmur heard. Pulmonary:     Effort: Pulmonary effort is normal. No respiratory distress.     Breath sounds: Normal breath sounds. No wheezing.  Musculoskeletal:     Cervical back: Neck supple.  Skin:    General: Skin is warm and dry.  Neurological:     Mental Status: Anna Pratt is alert and oriented to person, place, and time.  Psychiatric:        Mood and Affect: Mood normal.        Behavior: Behavior normal.        Thought Content: Thought content normal.        Judgment: Judgment normal.      BP (!) 170/105   Pulse 80   Ht 5\' 1"  (1.549 m)   Wt 189 lb 3.2 oz (85.8 kg)   BMI 35.75 kg/m  Wt Readings from Last 3 Encounters:  08/07/22 189 lb 3.2 oz (85.8 kg)  11/29/21 192 lb 4 oz (87.2 kg)  08/18/21 190 lb 8 oz (86.4 kg)       Assessment & Plan:   Problem List Items Addressed This Visit       Unprioritized   Tobacco abuse    Chantix is not covered by insurance, but will see if Anna Pratt can get it cheaper with Anna  good rx coupon. Discussed cessation.       Right-sided low back pain with right-sided  sciatica    Currently stable with prn use of gabapentin- monitor.       Relevant Medications   Varenicline Tartrate, Starter, (CHANTIX STARTING MONTH PAK) 0.5 MG X 11 & 1 MG X 42 TBPK   Prediabetes    Check A1C.       Post-surgical hypothyroidism   Relevant Orders   TSH   Essential (primary) hypertension    Hypertension: Elevated blood pressure at today's visit despite taking Amlodipine 5mg  daily. Anna Pratt has a history of weight fluctuations impacting blood pressure control. -Add Hydrochlorothiazide once daily in Anna morning. -Check blood pressure and potassium levels in one week.      Relevant Medications   hydrochlorothiazide (HYDRODIURIL) 25 MG tablet   Other Visit Diagnoses     Hyperglycemia    -  Primary   Relevant Orders   Comp Met (CMET)   HgB A1c   Breast cancer screening by mammogram       Relevant Orders   MM 3D SCREENING MAMMOGRAM BILATERAL BREAST   Postmenopausal estrogen deficiency       Relevant Orders   DG Bone Density      -Order mammogram and bone density scan. -Check A1C today due to history of borderline sugars. -Recommend Centrum for Women as a daily multivitamin. -Schedule physical exam in one month. -Recommend COVID booster in Anna fall. 60 minutes spent on today's visit. Time was spent interviewing Anna Pratt, reviewing medical records and formulating medical plan.  I am having Cordell M. Yorio start on hydrochlorothiazide, potassium chloride, and Varenicline Tartrate (Starter). I am also having Anna Pratt maintain Anna Pratt Omega-3 Fatty Acids (FISH OIL PO), Synthroid, gabapentin, and amLODipine.  Meds ordered this encounter  Medications   hydrochlorothiazide (HYDRODIURIL) 25 MG tablet    Sig: Take 1 tablet (25 mg total) by mouth daily.    Dispense:  90 tablet    Refill:  1    Order Specific Question:   Supervising Provider    Answer:   Danise Edge A [[id number]]    potassium chloride (KLOR-CON M) 10 MEQ tablet    Sig: Take 1 tablet (10 mEq total) by mouth 2 (two) times daily.    Dispense:  90 tablet    Refill:  1    Order Specific Question:   Supervising Provider    Answer:   Danise Edge A [[id number]]   Varenicline Tartrate, Starter, (CHANTIX STARTING MONTH PAK) 0.5 MG X 11 & 1 MG X 42 TBPK    Sig: Take one 0.5 mg tablet by mouth once daily for 3 days, then increase to one 0.5 mg tablet twice daily for 4 days, then increase to one 1 mg tablet twice daily.    Dispense:  53 each    Refill:  0    Order Specific Question:   Supervising Provider    Answer:   Danise Edge A [[id number]]

## 2022-08-07 NOTE — Telephone Encounter (Signed)
See mychart.  

## 2022-08-07 NOTE — Assessment & Plan Note (Signed)
Check A1C 

## 2022-08-07 NOTE — Assessment & Plan Note (Signed)
Currently stable with prn use of gabapentin- monitor.

## 2022-08-08 ENCOUNTER — Encounter (HOSPITAL_BASED_OUTPATIENT_CLINIC_OR_DEPARTMENT_OTHER): Payer: Self-pay

## 2022-08-08 ENCOUNTER — Ambulatory Visit (HOSPITAL_BASED_OUTPATIENT_CLINIC_OR_DEPARTMENT_OTHER)
Admission: RE | Admit: 2022-08-08 | Discharge: 2022-08-08 | Disposition: A | Discharge status: Home / Self Care | Payer: BC Managed Care – PPO | Source: Ambulatory Visit | Attending: Family | Admitting: Family

## 2022-08-08 DIAGNOSIS — Z1231 Encounter for screening mammogram for malignant neoplasm of breast: Secondary | ICD-10-CM

## 2022-08-08 DIAGNOSIS — Z780 Asymptomatic menopausal state: Secondary | ICD-10-CM | POA: Diagnosis not present

## 2022-08-15 ENCOUNTER — Other Ambulatory Visit (INDEPENDENT_AMBULATORY_CARE_PROVIDER_SITE_OTHER): Payer: BC Managed Care – PPO

## 2022-08-15 ENCOUNTER — Ambulatory Visit (INDEPENDENT_AMBULATORY_CARE_PROVIDER_SITE_OTHER): Payer: BC Managed Care – PPO

## 2022-08-15 DIAGNOSIS — I10 Essential (primary) hypertension: Secondary | ICD-10-CM

## 2022-08-15 LAB — BASIC METABOLIC PANEL
BUN: 23 mg/dL (ref 6–23)
CO2: 30 mEq/L (ref 19–32)
Calcium: 10.1 mg/dL (ref 8.4–10.5)
Chloride: 101 mEq/L (ref 96–112)
Creatinine, Ser: 0.8 mg/dL (ref 0.40–1.20)
GFR: 76.21 mL/min (ref >60.00)
Glucose, Bld: 91 mg/dL (ref 70–99)
Potassium: 4 mEq/L (ref 3.5–5.1)
Sodium: 140 mEq/L (ref 135–145)

## 2022-08-15 NOTE — Progress Notes (Signed)
Pt here for Blood pressure check per Melissa   Pt currently takes: Amlodipine 5 mg daily, hydrochlorothiazide 25 mg daily     Pt reports compliance with medication.  BP today @ = 132/88 HR = 74  Pt advised per Melissa to continue medication regime and keep appt for CPE in 1 month and to get a labs today.

## 2022-08-29 ENCOUNTER — Encounter (INDEPENDENT_AMBULATORY_CARE_PROVIDER_SITE_OTHER): Payer: Self-pay

## 2022-09-07 ENCOUNTER — Encounter: Payer: BC Managed Care – PPO | Admitting: Family

## 2022-09-14 ENCOUNTER — Ambulatory Visit (INDEPENDENT_AMBULATORY_CARE_PROVIDER_SITE_OTHER): Payer: BC Managed Care – PPO | Admitting: Family

## 2022-09-14 VITALS — BP 145/81 | HR 75 | Temp 97.7°F | Resp 16 | Wt 189.0 lb

## 2022-09-14 DIAGNOSIS — L309 Dermatitis, unspecified: Secondary | ICD-10-CM

## 2022-09-14 DIAGNOSIS — R7303 Prediabetes: Secondary | ICD-10-CM | POA: Diagnosis not present

## 2022-09-14 DIAGNOSIS — E890 Postprocedural hypothyroidism: Secondary | ICD-10-CM

## 2022-09-14 DIAGNOSIS — Z720 Tobacco use: Secondary | ICD-10-CM | POA: Diagnosis not present

## 2022-09-14 DIAGNOSIS — Z0000 Encounter for general adult medical examination without abnormal findings: Secondary | ICD-10-CM | POA: Diagnosis not present

## 2022-09-14 DIAGNOSIS — I10 Essential (primary) hypertension: Secondary | ICD-10-CM | POA: Diagnosis not present

## 2022-09-14 MED ORDER — AMLODIPINE BESYLATE 5 MG PO TABS
7.5000 mg | ORAL_TABLET | Freq: Every day | ORAL | 0 refills | Status: DC
Start: 2022-09-14 — End: 2022-12-18

## 2022-09-14 MED ORDER — BETAMETHASONE DIPROPIONATE 0.05 % EX CREA
TOPICAL_CREAM | Freq: Two times a day (BID) | CUTANEOUS | 0 refills | Status: AC
Start: 2022-09-14 — End: ?

## 2022-09-14 NOTE — Assessment & Plan Note (Signed)
Counseled on importance of quitting smoking. She has chantix rx and is working towards a quit date.

## 2022-09-14 NOTE — Assessment & Plan Note (Signed)
  Up to date on RSV, pneumonia, and shingles vaccines. Discussed flu shot and COVID booster. -Encouraged to get flu shot and COVID booster at the pharmacy this fall-General Health Maintenance Up to date on bone density scan, mammogram, and colonoscopy. Discussed lung cancer screening. -Pt deferred decision on lung cancer screening. -Encouraged to maintain healthy diet and physical activity. -Plan for six-month follow-up visit.

## 2022-09-14 NOTE — Progress Notes (Signed)
Subjective:     Patient ID: Anna Pratt, female    DOB: 1955/10/15, 67 y.o.   MRN: [id number]  Chief Complaint  Patient presents with   Annual Exam    HPI  Discussed the use of AI scribe software for clinical note transcription with the patient, who gave verbal consent to proceed.  History of Present Illness   The patient, with a history of hypertension, presents for a routine follow-up and physical.  She reports that her blood pressure was 130 at a recent visit with a nurse. She takes two antihypertensive medications daily, one in the morning and one before work. She has noticed that her leg is less swollen after work since starting this regimen.  The patient is up-to-date on her preventive care, including a recent bone density test, colonoscopy and mammogram.  She has received the RSV vaccine and is up-to-date on her pneumonia and shingles vaccines.  The patient reports a generally healthy diet, although her work schedule makes it difficult to eat balanced meals every day. She tries to eat vegetables and lean proteins and avoid junk food. She has occasional for a fountain Coke, which she indulges occasionally. She has noticed a few pounds of weight loss since the fall and does not feel that her weight is a problem. She used to go to the gym twice a week and plans to resume this once she stops working.  The patient has dry skin and occasional eczema flare-ups, which she manages with Aquaphor. She has tinnitus, which she has noticed is worse after eating certain foods. She has no other current health concerns.      BP Readings from Last 3 Encounters:  09/14/22 (!) 145/81  08/07/22 (!) 170/105  11/29/21 130/80   Wt Readings from Last 3 Encounters:  09/14/22 189 lb (85.7 kg)  08/07/22 189 lb 3.2 oz (85.8 kg)  11/29/21 192 lb 4 oz (87.2 kg)       Health Maintenance Due  Topic Date Due   Lung Cancer Screening  Never done   COVID-19 Vaccine (8 - 2023-24 season)  02/16/2022   INFLUENZA VACCINE  08/30/2022    Past Medical History:  Diagnosis Date   Abnormal Pap smear of cervix    02-26-20   Cataract    Hypertension    Kidney stone    Osteoarthritis, knee    Thyroid goiter    Tobacco abuse 08/07/2022    Past Surgical History:  Procedure Laterality Date   ANKLE ARTHROPLASTY Right 2019   done after injury   ANKLE SURGERY Left    s/p injury   CESAREAN SECTION     KIDNEY STONE SURGERY     KNEE SURGERY Right    THYROID SURGERY     due to goiter 32 years ago    Family History  Problem Relation Age of Onset   Hypertension Father    Heart attack Father    Bone cancer Sister 59   Diabetes Maternal Grandmother     Social History   Socioeconomic History   Marital status: Single    Spouse name: Not on file   Number of children: Not on file   Years of education: Not on file   Highest education level: Not on file  Occupational History   Not on file  Tobacco Use   Smoking status: Every Day    Current packs/day: 0.00    Average packs/day: 0.5 packs/day for 51.4 years (25.7 ttl pk-yrs)    Types:  Cigarettes    Start date: 50    Last attempt to quit: 06/21/2019    Years since quitting: 3.2   Smokeless tobacco: Never  Substance and Sexual Activity   Alcohol use: Yes    Comment: socially   Drug use: Never   Sexual activity: Not Currently    Partners: Male    Birth control/protection: Post-menopausal  Other Topics Concern   Not on file  Social History Narrative   Works 3rd shift at [employer] ([employer]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) used to work at [employer]   Here from [place] to be near daughter and grandson   Lives with daughter   Single   Completed 1 year of college and some Scientist, 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 90 tablet 1   Omega-3 Fatty Acids (FISH OIL PO) Take by mouth.     potassium chloride (KLOR-CON M) 10 MEQ tablet Take 1 tablet (10 mEq total) by mouth 2 (two) times daily. 90 tablet 1   SYNTHROID 175 MCG tablet Take 1 tablet by mouth once daily 90 tablet 3   Varenicline Tartrate, Starter, (CHANTIX STARTING MONTH PAK) 0.5 MG X 11 & 1 MG X 42 TBPK Take one 0.5 mg tablet by mouth once daily for 3 days, then increase to one 0.5 mg tablet twice daily for 4 days, then increase to one 1 mg tablet twice daily. 53 each 0   amLODipine (NORVASC) 5 MG tablet Take 1 tablet by mouth once daily 90 tablet 0   No facility-administered medications prior to visit.    No Known Allergies  Review of Systems  Constitutional:  Negative for weight loss.  HENT:  Positive for tinnitus (with certain foods only). Negative for congestion and hearing loss.   Eyes:  Negative for blurred vision.  Respiratory:  Negative for cough.   Cardiovascular:  Negative for leg swelling.  Gastrointestinal:  Negative for constipation and diarrhea.  Genitourinary:  Negative for dysuria and frequency.  Musculoskeletal:  Negative for joint pain and myalgias.  Skin:  Negative for rash.  Neurological:  Negative for headaches.  Psychiatric/Behavioral:         Denies anxiety/depression       Objective:    Physical Exam Constitutional:      General: She is not in acute distress.    Appearance: Normal appearance. She is well-developed.  HENT:     Head: Normocephalic and atraumatic.     Right Ear: External ear normal.     Left Ear: External ear normal.  Eyes:     General: No scleral icterus. Neck:      Thyroid: No thyromegaly.  Cardiovascular:     Rate and Rhythm: Normal rate and regular rhythm.     Heart sounds: Normal heart sounds. No murmur heard. Pulmonary:     Effort: Pulmonary effort is normal. No respiratory distress.     Breath sounds: Normal breath sounds. No wheezing.  Musculoskeletal:        General: No swelling.     Cervical back: Neck supple.  Skin:    General: Skin is warm and dry.     Comments: Some eczema noted on left elbow  Neurological:     Mental Status: She is alert and oriented to person, place, and time.  Psychiatric:        Mood and Affect: Mood normal.        Behavior: Behavior normal.        Thought Content: Thought content normal.        Judgment: Judgment normal.      BP (!) 145/81 (BP Location: Right Arm, Patient Position: Sitting, Cuff Size: Small)   Pulse 75   Temp 97.7 F (36.5 C) (Oral)   Resp 16   Wt 189 lb (85.7 kg)   SpO2 98%   BMI 35.71 kg/m  Wt Readings from Last 3 Encounters:  09/14/22 189 lb (85.7 kg)  08/07/22 189 lb 3.2 oz (85.8 kg)  11/29/21 192 lb 4 oz (87.2 kg)       Assessment & Plan:   Problem List Items Addressed This Visit       Unprioritized   Tobacco abuse    Counseled on importance of quitting smoking. She has chantix rx and is working towards a quit date.      Preventative health care     Up to date on RSV, pneumonia, and shingles vaccines. Discussed flu shot and COVID booster. -Encouraged to get flu shot and COVID booster at the pharmacy this fall-General Health Maintenance Up to date on bone density scan, mammogram, and colonoscopy. Discussed lung cancer screening. -Pt deferred decision on lung cancer screening. -Encouraged to maintain healthy diet and physical activity. -Plan for six-month follow-up visit.      Prediabetes    Lab Results  Component Value Date   HGBA1C 5.9 08/07/2022   A1C stable with diet.  Monitor.       Post-surgical hypothyroidism    Lab Results  Component Value Date    TSH 1.60 08/07/2022   TSH stable. Continue current dose of synthroid.       Essential hypertension    BP slightly above goal. Will increase amlodipine from 5mg  to 7.5mg  once daily. Continue hydrochlorothiazide.       Relevant Medications   amLODipine (NORVASC) 5 MG tablet   Eczema - Primary    Rx with prn betamethasone.       Relevant Medications   betamethasone dipropionate 0.05 % cream    I have changed Yanett M. Ploeger's amLODipine. I am also having her start on betamethasone dipropionate. Additionally, I am having her maintain her Omega-3 Fatty Acids (FISH OIL PO), Synthroid, gabapentin, hydrochlorothiazide, potassium chloride, and Varenicline Tartrate (Starter).  Meds ordered this encounter  Medications   betamethasone dipropionate 0.05 % cream    Sig: Apply topically 2 (two) times daily.    Dispense:  30 g    Refill:  0    Order Specific Question:   Supervising Provider    Answer:   Danise Edge A [[id number]]   amLODipine (NORVASC) 5 MG tablet    Sig: Take 1.5 tablets (7.5 mg total) by mouth daily.    Dispense:  135 tablet    Refill:  0    Order Specific Question:   Supervising Provider    Answer:   Danise Edge A [[id number]]

## 2022-09-14 NOTE — Assessment & Plan Note (Signed)
Lab Results  Component Value Date   TSH 1.60 08/07/2022   TSH stable. Continue current dose of synthroid.

## 2022-09-14 NOTE — Assessment & Plan Note (Signed)
Rx with prn betamethasone.

## 2022-09-14 NOTE — Assessment & Plan Note (Signed)
Lab Results  Component Value Date   HGBA1C 5.9 08/07/2022   A1C stable with diet.  Monitor.

## 2022-09-14 NOTE — Assessment & Plan Note (Signed)
BP slightly above goal. Will increase amlodipine from 5mg  to 7.5mg  once daily. Continue hydrochlorothiazide.

## 2022-10-27 ENCOUNTER — Other Ambulatory Visit: Payer: Self-pay | Admitting: Family Medicine

## 2022-10-27 DIAGNOSIS — I10 Essential (primary) hypertension: Secondary | ICD-10-CM

## 2022-11-16 ENCOUNTER — Other Ambulatory Visit: Payer: Self-pay | Admitting: Family

## 2022-11-30 ENCOUNTER — Telehealth: Payer: Self-pay

## 2022-11-30 ENCOUNTER — Ambulatory Visit (INDEPENDENT_AMBULATORY_CARE_PROVIDER_SITE_OTHER): Payer: BC Managed Care – PPO | Admitting: Family

## 2022-11-30 VITALS — BP 139/72 | HR 71 | Temp 97.8°F | Resp 16 | Ht 61.0 in | Wt 193.0 lb

## 2022-11-30 DIAGNOSIS — L309 Dermatitis, unspecified: Secondary | ICD-10-CM | POA: Diagnosis not present

## 2022-11-30 DIAGNOSIS — I10 Essential (primary) hypertension: Secondary | ICD-10-CM

## 2022-11-30 DIAGNOSIS — R7303 Prediabetes: Secondary | ICD-10-CM

## 2022-11-30 DIAGNOSIS — M1712 Unilateral primary osteoarthritis, left knee: Secondary | ICD-10-CM | POA: Insufficient documentation

## 2022-11-30 DIAGNOSIS — Z720 Tobacco use: Secondary | ICD-10-CM | POA: Diagnosis not present

## 2022-11-30 DIAGNOSIS — E890 Postprocedural hypothyroidism: Secondary | ICD-10-CM

## 2022-11-30 MED ORDER — VARENICLINE TARTRATE 1 MG PO TABS: 1.0000 mg | ORAL_TABLET | Freq: Two times a day (BID) | ORAL | 1 refills | Status: DC

## 2022-11-30 NOTE — Telephone Encounter (Signed)
See mychart.  

## 2022-11-30 NOTE — Assessment & Plan Note (Addendum)
Continue on Amlodipine and Hydrochlorothiazide.  BP Readings from Last 3 Encounters:  11/30/22 139/72  09/14/22 (!) 145/81  08/07/22 (!) 170/105   BP at goal.

## 2022-11-30 NOTE — Assessment & Plan Note (Addendum)
She is smoking about 5 cigarettes/day. Restart chantix continuation moth pack.

## 2022-11-30 NOTE — Patient Instructions (Signed)
VISIT SUMMARY:  During today's visit, we discussed your primary concern of weight loss, your progress with smoking cessation, knee pain, and intermittent arm pain. We also reviewed general health maintenance items.  YOUR PLAN:  -WEIGHT MANAGEMENT: Weight management involves strategies to achieve and maintain a healthy weight. We discussed various weight loss medications, but noted that your insurance does not cover them. You are considering compounded weight loss medication from an online pharmacy, and we discussed the risks and benefits of this option. Additionally, we talked about a possible referral to the [facility] for further support.  -SMOKING CESSATION: Smoking cessation is the process of quitting smoking. You are currently using Chantix and have reduced your smoking to about 5 cigarettes per day. The goal is to reduce this to zero. Continue taking Chantix for another 8 weeks to help achieve this goal.  -KNEE PAIN: Knee pain can result from various conditions, including arthritis or injury. You have a history of a right knee replacement and are experiencing pain in your left knee, with possible surgery next year. If the pain worsens, consider an orthopedic consultation.  -INTERMITTENT ARM PAIN: Intermittent arm pain can be caused by issues such as carpal tunnel syndrome or nerve irritation from the neck. You described the pain as occasional and sharp, but not severe. Monitor your symptoms and report if the pain becomes severe.  -GENERAL HEALTH MAINTENANCE: General health maintenance includes routine check-ups and screenings to ensure overall health. We have ordered a lung cancer screening CT scan, pending insurance approval. We will follow up in 4 months to review your progress and any new concerns.  INSTRUCTIONS:  Please follow up in 4 months for a routine check-up. Continue taking Chantix for another 8 weeks to aid in smoking cessation. Monitor your arm pain  and report if it becomes severe. If your knee pain worsens, consider scheduling an orthopedic consultation. We will also await insurance approval for your lung cancer screening CT scan.

## 2022-11-30 NOTE — Progress Notes (Signed)
Subjective:     Patient ID: Anna Pratt, female    DOB: 1955/05/15, 67 y.o.   MRN: [id number]  Chief Complaint  Patient presents with   Hypothyroidism    Here for follow up    Weight Management Screening    Will like to talk about her weight    HPI  Discussed the use of AI scribe software for clinical note transcription with the patient, who gave verbal consent to proceed.   Patient is a 67 year old female that presents to the clinic today for follow up for hypothyroidism and wanting to talk about weight loss. She does have a past medical history of hypertension and hypothyroidism. She is currently on two antihypertensive medications and is doing well with those at the moment. Blood pressure is in range today. No complaints of any swelling in the feet or ankles noted from patient. Patient states that her biggest issue that she is having now is with her weight. She states that she is trying to eat healthy but nothing is helping her loose weight. Patient states she goes to gym twice a week. She states she does not eat a lot of junk food or soda but nothing is helping wiith her weight. She is wanting to discuss weight loss injections.    Updated patient that her last thyroid level was in range at 1.60 so I would double check with Melissa about rechecking TSH levels since the recent one was normal. Patient denies any issues noted with feeling fatigued or no energy.    Health Maintenance Due  Topic Date Due   Lung Cancer Screening  Never done    Past Medical History:  Diagnosis Date   Abnormal Pap smear of cervix    02-26-20   Cataract    Hypertension    Kidney stone    Osteoarthritis, knee    Thyroid goiter    Tobacco abuse 08/07/2022    Past Surgical History:  Procedure Laterality Date   ANKLE ARTHROPLASTY Right 2019   done after injury   ANKLE SURGERY Left    s/p injury   CESAREAN SECTION     KIDNEY STONE SURGERY     KNEE SURGERY Right    THYROID  SURGERY     due to goiter 32 years ago    Family History  Problem Relation Age of Onset   Hypertension Father    Heart attack Father    Bone cancer Sister 57   Diabetes Maternal Grandmother     Social History   Socioeconomic History   Marital status: Single    Spouse name: Not on file   Number of children: Not on file   Years of education: Not on file   Highest education level: Some college, no degree  Occupational History   Not on file  Tobacco Use   Smoking status: Every Day    Current packs/day: 0.00    Average packs/day: 0.5 packs/day for 51.4 years (25.7 ttl pk-yrs)    Types: Cigarettes    Start date: 60    Last attempt to quit: 06/21/2019    Years since quitting: 3.4   Smokeless tobacco: Never  Substance and Sexual Activity   Alcohol use: Yes    Comment: socially   Drug use: Never   Sexual activity: Not Currently    Partners: Male    Birth control/protection: Post-menopausal  Other Topics Concern   Not on file  Social History Narrative   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) used to work at [employer]   Here from [place] to be near daughter and grandson   Lives with daughter   Single   Completed 1 year of college and some technical school   Enjoys television   Social Determinants of Health   Financial Resource Strain: Low Risk  (11/29/2022)   Overall Financial Resource Strain (CARDIA)    Difficulty of Paying Living Expenses: Not hard at all  Food Insecurity: No Food Insecurity (11/29/2022)   Hunger Vital Sign    Worried About Running Out of Food in the Last Year: Never true    Ran Out of Food in the Last Year: Never true  Transportation Needs: No Transportation Needs (11/29/2022)   PRAPARE - Administrator, Civil Service (Medical): No    Lack of Transportation (Non-Medical): No  Physical Activity: Insufficiently Active (11/29/2022)   Exercise Vital Sign    Days of Exercise per Week: 3 days    Minutes of  Exercise per Session: 30 min  Stress: No Stress Concern Present (11/29/2022)   [employer] of Occupational Health - Occupational Stress Questionnaire    Feeling of Stress : Not at all  Social Connections: Socially Isolated (11/29/2022)   Social Connection and Isolation Panel [NHANES]    Frequency of Communication with Friends and Family: Once a week    Frequency of Social Gatherings with Friends and Family: Once a week    Attends Religious Services: Never    Database administrator or Organizations: Yes    Attends Engineer, structural: More than 4 times per year    Marital Status: Never married  Intimate Partner Violence: Not on file    Outpatient Medications Prior to Visit  Medication Sig Dispense Refill   amLODipine (NORVASC) 5 MG tablet Take 1.5 tablets (7.5 mg total) by mouth daily. 135 tablet 0   betamethasone dipropionate 0.05 % cream Apply topically 2 (two) times daily. 30 g 0   gabapentin (NEURONTIN) 100 MG capsule TAKE 1 CAPSULE BY MOUTH THREE TIMES DAILY 90 capsule 3   hydrochlorothiazide (HYDRODIURIL) 25 MG tablet Take 1 tablet (25 mg total) by mouth daily. 90 tablet 1   Omega-3 Fatty Acids (FISH OIL PO) Take by mouth.     potassium chloride (KLOR-CON) 10 MEQ tablet Take 1 tablet by mouth twice daily 180 tablet 1   SYNTHROID 175 MCG tablet Take 1 tablet by mouth once daily 90 tablet 3   Varenicline Tartrate, Starter, (CHANTIX STARTING MONTH PAK) 0.5 MG X 11 & 1 MG X 42 TBPK Take one 0.5 mg tablet by mouth once daily for 3 days, then increase to one 0.5 mg tablet twice daily for 4 days, then increase to one 1 mg tablet twice daily. 53 each 0   No facility-administered medications prior to visit.    No Known Allergies  Review of Systems  Constitutional:  Negative for chills and fever.  HENT:  Negative for ear pain, sinus pain and sore throat.   Eyes:  Negative for pain.  Respiratory:  Negative for cough and wheezing.   Cardiovascular:  Negative for chest  pain and palpitations.  Gastrointestinal:  Negative for nausea and vomiting.  Genitourinary:  Negative for dysuria.  Musculoskeletal:  Negative for back pain and neck pain.  Skin:  Negative for itching and rash.  Neurological:  Negative for dizziness and headaches.  Psychiatric/Behavioral:  Negative for depression.        Objective:  Physical Exam Constitutional:      Appearance: Normal appearance. She is normal weight.  Cardiovascular:     Rate and Rhythm: Normal rate and regular rhythm.     Pulses: Normal pulses.     Heart sounds: Normal heart sounds.  Pulmonary:     Effort: Pulmonary effort is normal.     Breath sounds: Normal breath sounds.  Musculoskeletal:        General: Normal range of motion.     Cervical back: Normal range of motion.  Skin:    General: Skin is warm.     Capillary Refill: Capillary refill takes less than 2 seconds.  Neurological:     General: No focal deficit present.     Mental Status: She is alert and oriented to person, place, and time. Mental status is at baseline.  Psychiatric:        Mood and Affect: Mood normal.        Behavior: Behavior normal.        Thought Content: Thought content normal.        Judgment: Judgment normal.      BP 139/72 (BP Location: Right Arm, Patient Position: Sitting, Cuff Size: Normal)   Pulse 71   Temp 97.8 F (36.6 C) (Oral)   Resp 16   Ht 5\' 1"  (1.549 m)   Wt 193 lb (87.5 kg)   SpO2 96%   BMI 36.47 kg/m  Wt Readings from Last 3 Encounters:  11/30/22 193 lb (87.5 kg)  09/14/22 189 lb (85.7 kg)  08/07/22 189 lb 3.2 oz (85.8 kg)       Assessment & Plan:   Problem List Items Addressed This Visit   None   I am having Roan M. Kindel maintain her Omega-3 Fatty Acids (FISH OIL PO), Synthroid, gabapentin, hydrochlorothiazide, Varenicline Tartrate (Starter), betamethasone dipropionate, amLODipine, and potassium chloride.  No orders of the defined types were placed in this encounter.

## 2022-11-30 NOTE — Telephone Encounter (Signed)
Pt's insurance does not cover Chantix.   70 *[id number]* Preferred products are: [id number] - BUPROPION TAB 150MG  SR, [id number] - NICOTROL NS SPR 10MG /ML*[id number]*Please visit [email or web address] for assistance resolvingthe most common patient rejections

## 2022-11-30 NOTE — Assessment & Plan Note (Addendum)
We discussed diet, exercise.  Offered referral to [facility]. She declined at this time. She is considering reaching out to an online provider about beginning a compounded GLP-1.  We discussed the fact that the compounded formulas are not FDA approved and not as closely regulated.

## 2022-11-30 NOTE — Assessment & Plan Note (Addendum)
Lab Results  Component Value Date   TSH 1.60 08/07/2022   TSH stable. Continue current dose of synthroid.

## 2022-11-30 NOTE — Progress Notes (Signed)
Subjective:     Patient ID: Anna Pratt, female    DOB: Nov 01, 1955, 67 y.o.   MRN: [id number]  Chief Complaint  Patient presents with   Hypothyroidism    Here for follow up    Weight Management Screening    Will like to talk about her weight    HPI  Discussed the use of AI scribe software for clinical note transcription with the patient, who gave verbal consent to proceed.  History of Present Illness   The patient, with a history of knee problems and smoking, presents with a primary concern of weight loss. She has been considering weight loss medications, but her insurance does not cover them. She has been researching online and found a compounding pharmacy that offers weight loss injections, but she is hesitant due to the cost and potential interactions with her current medications. The patient is also a smoker and has been using Chantix to help quit. She reports that she is doing well with it, but still experiences cravings, especially during times of stress. The patient also mentions experiencing occasional sharp pains in her arm, which she describes as feeling like a pen sticking in her arm. She has a history of knee problems and has had a knee replacement in the past. The patient reports that her other knee has been acting up lately and she is considering having surgery on it next year.          Health Maintenance Due  Topic Date Due   Lung Cancer Screening  Never done    Past Medical History:  Diagnosis Date   Abnormal Pap smear of cervix    02-26-20   Cataract    Hypertension    Kidney stone    Osteoarthritis, knee    Thyroid goiter    Tobacco abuse 08/07/2022    Past Surgical History:  Procedure Laterality Date   ANKLE ARTHROPLASTY Right 2019   done after injury   ANKLE SURGERY Left    s/p injury   CESAREAN SECTION     KIDNEY STONE SURGERY     KNEE SURGERY Right    THYROID SURGERY     due to goiter 32 years ago    Family History  Problem  Relation Age of Onset   Hypertension Father    Heart attack Father    Bone cancer Sister 29   Diabetes Maternal Grandmother     Social History   Socioeconomic History   Marital status: Single    Spouse name: Not on file   Number of children: Not on file   Years of education: Not on file   Highest education level: Some college, no degree  Occupational History   Not on file  Tobacco Use   Smoking status: Every Day    Current packs/day: 0.00    Average packs/day: 0.5 packs/day for 51.4 years (25.7 ttl pk-yrs)    Types: Cigarettes    Start date: 1    Last attempt to quit: 06/21/2019    Years since quitting: 3.4   Smokeless tobacco: Never  Substance and Sexual Activity   Alcohol use: Yes    Comment: socially   Drug use: Never   Sexual activity: Not Currently    Partners: Male    Birth control/protection: Post-menopausal  Other Topics Concern   Not on file  Social History Narrative   Works 3rd shift at [employer] ([employer]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) used to work at [employer]   Here from  [facility] to be near daughter and grandson   Lives with daughter   Single   Completed 1 year of college and some technical school   Enjoys television   Social Determinants of Health   Financial Resource Strain: Low Risk  (11/29/2022)   Overall Financial Resource Strain (CARDIA)    Difficulty of Paying Living Expenses: Not hard at all  Food Insecurity: No Food Insecurity (11/29/2022)   Hunger Vital Sign    Worried About Running Out of Food in the Last Year: Never true    Ran Out of Food in the Last Year: Never true  Transportation Needs: No Transportation Needs (11/29/2022)   PRAPARE - Administrator, Civil Service (Medical): No    Lack of Transportation (Non-Medical): No  Physical Activity: Insufficiently Active (11/29/2022)   Exercise Vital Sign    Days of Exercise per Week: 3 days    Minutes of Exercise per Session: 30 min  Stress: No Stress Concern Present  (11/29/2022)   [employer] of Occupational Health - Occupational Stress Questionnaire    Feeling of Stress : Not at all  Social Connections: Socially Isolated (11/29/2022)   Social Connection and Isolation Panel [NHANES]    Frequency of Communication with Friends and Family: Once a week    Frequency of Social Gatherings with Friends and Family: Once a week    Attends Religious Services: Never    Database administrator or Organizations: Yes    Attends Engineer, structural: More than 4 times per year    Marital Status: Never married  Intimate Partner Violence: Not on file    Outpatient Medications Prior to Visit  Medication Sig Dispense Refill   amLODipine (NORVASC) 5 MG tablet Take 1.5 tablets (7.5 mg total) by mouth daily. 135 tablet 0   betamethasone dipropionate 0.05 % cream Apply topically 2 (two) times daily. 30 g 0   gabapentin (NEURONTIN) 100 MG capsule TAKE 1 CAPSULE BY MOUTH THREE TIMES DAILY 90 capsule 3   hydrochlorothiazide (HYDRODIURIL) 25 MG tablet Take 1 tablet (25 mg total) by mouth daily. 90 tablet 1   Omega-3 Fatty Acids (FISH OIL PO) Take by mouth.     potassium chloride (KLOR-CON) 10 MEQ tablet Take 1 tablet by mouth twice daily 180 tablet 1   SYNTHROID 175 MCG tablet Take 1 tablet by mouth once daily 90 tablet 3   Varenicline Tartrate, Starter, (CHANTIX STARTING MONTH PAK) 0.5 MG X 11 & 1 MG X 42 TBPK Take one 0.5 mg tablet by mouth once daily for 3 days, then increase to one 0.5 mg tablet twice daily for 4 days, then increase to one 1 mg tablet twice daily. 53 each 0   No facility-administered medications prior to visit.    No Known Allergies  ROS    See HPI Objective:    Physical Exam Constitutional:      General: She is not in acute distress.    Appearance: Normal appearance. She is well-developed.  HENT:     Head: Normocephalic and atraumatic.     Right Ear: External ear normal.     Left Ear: External ear normal.  Eyes:     General:  No scleral icterus. Neck:     Thyroid: No thyromegaly.  Cardiovascular:     Rate and Rhythm: Normal rate and regular rhythm.     Heart sounds: Normal heart sounds. No murmur heard. Pulmonary:     Effort: Pulmonary effort is normal. No respiratory  distress.     Breath sounds: Normal breath sounds. No wheezing.  Musculoskeletal:     Cervical back: Neck supple.  Skin:    General: Skin is warm and dry.  Neurological:     Mental Status: She is alert and oriented to person, place, and time.  Psychiatric:        Mood and Affect: Mood normal.        Behavior: Behavior normal.        Thought Content: Thought content normal.        Judgment: Judgment normal.      BP 139/72 (BP Location: Right Arm, Patient Position: Sitting, Cuff Size: Normal)   Pulse 71   Temp 97.8 F (36.6 C) (Oral)   Resp 16   Ht 5\' 1"  (1.549 m)   Wt 193 lb (87.5 kg)   SpO2 96%   BMI 36.47 kg/m  Wt Readings from Last 3 Encounters:  11/30/22 193 lb (87.5 kg)  09/14/22 189 lb (85.7 kg)  08/07/22 189 lb 3.2 oz (85.8 kg)       Assessment & Plan:   Problem List Items Addressed This Visit       Unprioritized   Tobacco abuse    She is smoking about 5 cigarettes/day. Restart chantix continuation moth pack.       Relevant Medications   varenicline (CHANTIX CONTINUING MONTH PAK) 1 MG tablet   Other Relevant Orders   CT CHEST LUNG CA SCREEN LOW DOSE W/O CM   Prediabetes    Lab Results  Component Value Date   HGBA1C 5.9 08/07/2022   Stable/improved last visit.      Post-surgical hypothyroidism    Lab Results  Component Value Date   TSH 1.60 08/07/2022   TSH stable. Continue current dose of synthroid.       Osteoarthritis of left knee    Sees Dr. Eulah Pont and plans to follow up with them.       Morbid obesity (HCC)    We discussed diet, exercise.  Offered referral to [facility] weight [facility] wellness. She declined at this time. She is considering reaching out to an online provider about beginning a  compounded GLP-1.  We discussed the fact that the compounded formulas are not FDA approved and not as closely regulated.      Essential hypertension - Primary    Continue on Amlodipine and Hydrochlorothiazide.  BP Readings from Last 3 Encounters:  11/30/22 139/72  09/14/22 (!) 145/81  08/07/22 (!) 170/105   BP at goal.       Eczema    Stable at this time         I have discontinued Dyamon M. Scriven's Varenicline Tartrate (Starter). I am also having her start on varenicline. Additionally, I am having her maintain her Omega-3 Fatty Acids (FISH OIL PO), Synthroid, gabapentin, hydrochlorothiazide, betamethasone dipropionate, amLODipine, and potassium chloride.  Meds ordered this encounter  Medications   varenicline (CHANTIX CONTINUING MONTH PAK) 1 MG tablet    Sig: Take 1 tablet (1 mg total) by mouth 2 (two) times daily.    Dispense:  60 tablet    Refill:  1    Order Specific Question:   Supervising Provider    Answer:   Danise Edge A [[id number]]

## 2022-11-30 NOTE — Assessment & Plan Note (Signed)
Lab Results  Component Value Date   HGBA1C 5.9 08/07/2022   Stable/improved last visit.

## 2022-11-30 NOTE — Assessment & Plan Note (Signed)
Sees Dr. Eulah Pont and plans to follow up with them.

## 2022-11-30 NOTE — Assessment & Plan Note (Signed)
Stable at this time 

## 2022-12-17 ENCOUNTER — Ambulatory Visit: Payer: BC Managed Care – PPO | Admitting: Family

## 2022-12-18 ENCOUNTER — Other Ambulatory Visit: Payer: Self-pay | Admitting: Family Medicine

## 2022-12-18 ENCOUNTER — Other Ambulatory Visit: Payer: Self-pay | Admitting: Family

## 2022-12-18 DIAGNOSIS — I10 Essential (primary) hypertension: Secondary | ICD-10-CM

## 2022-12-24 ENCOUNTER — Telehealth (HOSPITAL_BASED_OUTPATIENT_CLINIC_OR_DEPARTMENT_OTHER): Payer: Self-pay

## 2022-12-24 ENCOUNTER — Ambulatory Visit (INDEPENDENT_AMBULATORY_CARE_PROVIDER_SITE_OTHER): Payer: BC Managed Care – PPO | Admitting: Family

## 2022-12-24 VITALS — BP 139/76 | HR 74 | Temp 97.9°F | Resp 16 | Ht 61.0 in | Wt 191.0 lb

## 2022-12-24 DIAGNOSIS — Z6836 Body mass index (BMI) 36.0-36.9, adult: Secondary | ICD-10-CM

## 2022-12-24 DIAGNOSIS — Z720 Tobacco use: Secondary | ICD-10-CM

## 2022-12-24 DIAGNOSIS — R739 Hyperglycemia, unspecified: Secondary | ICD-10-CM

## 2022-12-24 DIAGNOSIS — I10 Essential (primary) hypertension: Secondary | ICD-10-CM

## 2022-12-24 DIAGNOSIS — R7303 Prediabetes: Secondary | ICD-10-CM

## 2022-12-24 DIAGNOSIS — E039 Hypothyroidism, unspecified: Secondary | ICD-10-CM

## 2022-12-24 LAB — BASIC METABOLIC PANEL
BUN: 14 mg/dL (ref 6–23)
CO2: 28 meq/L (ref 19–32)
Calcium: 9.5 mg/dL (ref 8.4–10.5)
Chloride: 101 meq/L (ref 96–112)
Creatinine, Ser: 0.84 mg/dL (ref 0.40–1.20)
GFR: 71.69 mL/min (ref >60.00)
Glucose, Bld: 73 mg/dL (ref 70–99)
Potassium: 4.2 meq/L (ref 3.5–5.1)
Sodium: 139 meq/L (ref 135–145)

## 2022-12-24 LAB — TSH: TSH: 6.7 u[IU]/mL — ABNORMAL HIGH (ref 0.35–5.50)

## 2022-12-24 LAB — HEMOGLOBIN A1C: Hgb A1c MFr Bld: 6.3 % (ref 4.6–6.5)

## 2022-12-24 NOTE — Patient Instructions (Signed)
VISIT SUMMARY:  Anna Pratt, you came in today for a routine follow-up. We discussed your ongoing issues with Graves' disease, hypertension, knee pain, back pain, and smoking cessation. You also mentioned recent weight gain and discomfort, and we are awaiting approval for weight loss injections. An MRI was recently done for your knee pain, and we are waiting for the results. Your blood pressure is in the high normal range, and you are managing your back pain with gabapentin. You have reduced your smoking significantly with the help of Chantix. We also discussed your need for a tetanus shot and reviewed your general health maintenance.  YOUR PLAN:  -GRAVES' DISEASE: Graves' disease is an autoimmune disorder that causes hyperthyroidism. Your condition is stable on Synthroid, and we will continue with your current management plan.  -KNEE PAIN: You are experiencing knee pain that feels like 'bone rub against bone.' We recently performed an MRI to investigate further, and we are awaiting the results. Continue with your current management plan.  -HYPERTENSION: Hypertension is high blood pressure. Your blood pressure is in the high normal range with your current medications, Amlodipine and Hydrochlorothiazide. We will continue with your current management plan.  -OBESITY: Obesity is a condition characterized by excessive body fat. You are uncomfortable with your recent weight gain and are awaiting approval for weight loss injections. We will check your labs today, including thyroid, kidney function, potassium, and glucose.  -SMOKING CESSATION: You are working on quitting smoking and have reduced your intake to less than half a pack per day with the help of Chantix. Continue taking Chantix and pick up your continuation pack from the pharmacy.  -BACK PAIN: You have back pain that may be related to your sciatic nerve. You are managing it with Gabapentin as needed and occasional Advil. Please limit your use of  Advil due to its potential impact on your kidneys.  -GENERAL HEALTH MAINTENANCE: You need to get a tetanus shot at the pharmacy. We will also check your cholesterol today. Follow up in three months.  INSTRUCTIONS:  Please follow up in three months. Make sure to get your tetanus shot at the pharmacy and pick up your continuation pack of Chantix. We will check your labs today, including thyroid, kidney function, potassium, glucose, and cholesterol.

## 2022-12-24 NOTE — Assessment & Plan Note (Signed)
Update A1c ?

## 2022-12-24 NOTE — Progress Notes (Signed)
Subjective:     Patient ID: Anna Pratt, female    DOB: [birth year], 67 y.o.   MRN: [id number]  Chief Complaint  Patient presents with   Hypertension    Here for follow up    HPI  Discussed the use of AI scribe software for clinical note transcription with the patient, who gave verbal consent to proceed.  History of Present Illness   Anna Pratt, a patient with a history of Graves' disease, hypertension, and back pain, presents for a routine follow-up. She reports discomfort due to recent weight gain and is awaiting approval for weight loss injections. She also mentions knee pain, which she describes as feeling like "bone rub against bone." An MRI was recently performed to investigate the cause of this pain, and results are pending.  Anna Pratt's hypertension is managed with amlodipine and hydrochlorothiazide, but she still finds her blood pressure to be high, albeit within the high normal range. She also takes gabapentin as needed for back pain, which she believes may be related to her sciatic nerve. She reports that the gabapentin is effective in managing her pain.  She is also on Chantix for smoking cessation and has reduced her smoking to less than half a pack per day. She is awaiting a refill of her Chantix prescription.  Anna Pratt also mentions that she has been recommended to receive a tetanus shot at her pharmacy. She has received all her other vaccinations, including the flu shot and pneumonia shot, at the pharmacy.      BP Readings from Last 3 Encounters:  12/24/22 139/76  11/30/22 139/72  09/14/22 (!) 145/81   ;     Health Maintenance Due  Topic Date Due   Lung Cancer Screening  Never done    Past Medical History:  Diagnosis Date   Abnormal Pap smear of cervix    02-26-20   Cataract    Hypertension    Kidney stone    Osteoarthritis, knee    Thyroid goiter    Tobacco abuse 08/07/2022    Past Surgical History:  Procedure Laterality Date    ANKLE ARTHROPLASTY Right 2019   done after injury   ANKLE SURGERY Left    s/p injury   CESAREAN SECTION     KIDNEY STONE SURGERY     KNEE SURGERY Right    THYROID SURGERY     due to goiter 32 years ago    Family History  Problem Relation Age of Onset   Hypertension Father    Heart attack Father    Bone cancer Sister 41   Diabetes Maternal Grandmother     Social History   Socioeconomic History   Marital status: Single    Spouse name: Not on file   Number of children: Not on file   Years of education: Not on file   Highest education level: Some college, no degree  Occupational History   Not on file  Tobacco Use   Smoking status: Every Day    Current packs/day: 0.00    Average packs/day: 0.5 packs/day for 51.4 years (25.7 ttl pk-yrs)    Types: Cigarettes    Start date: 53    Last attempt to quit: 06/21/2019    Years since quitting: 3.5   Smokeless tobacco: Never  Substance and Sexual Activity   Alcohol use: Yes    Comment: socially   Drug use: Never   Sexual activity: Not Currently    Partners: Male    Birth control/protection: Post-menopausal  Other  Topics Concern   Not on file  Social History Narrative   Works 3rd shift at [employer] ([employer]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) used to work at [employer]   Here from [place] to be near daughter and grandson   Lives with daughter   Single   Completed 1 year of college and some technical school   Enjoys television   Social Determinants of Health   Financial Resource Strain: Low Risk  (11/29/2022)   Overall Financial Resource Strain (CARDIA)    Difficulty of Paying Living Expenses: Not hard at all  Food Insecurity: No Food Insecurity (11/29/2022)   Hunger Vital Sign    Worried About Running Out of Food in the Last Year: Never true    Ran Out of Food in the Last Year: Never true  Transportation Needs: No Transportation Needs (11/29/2022)   PRAPARE - Administrator, Civil Service (Medical): No    Lack  of Transportation (Non-Medical): No  Physical Activity: Insufficiently Active (11/29/2022)   Exercise Vital Sign    Days of Exercise per Week: 3 days    Minutes of Exercise per Session: 30 min  Stress: No Stress Concern Present (11/29/2022)   [employer] of Occupational Health - Occupational Stress Questionnaire    Feeling of Stress : Not at all  Social Connections: Socially Isolated (11/29/2022)   Social Connection and Isolation Panel [NHANES]    Frequency of Communication with Friends and Family: Once a week    Frequency of Social Gatherings with Friends and Family: Once a week    Attends Religious Services: Never    Database administrator or Organizations: Yes    Attends Engineer, structural: More than 4 times per year    Marital Status: Never married  Intimate Partner Violence: Not on file    Outpatient Medications Prior to Visit  Medication Sig Dispense Refill   amLODipine (NORVASC) 5 MG tablet Take 1.5 tablets (7.5 mg total) by mouth daily. 135 tablet 0   betamethasone dipropionate 0.05 % cream Apply topically 2 (two) times daily. 30 g 0   gabapentin (NEURONTIN) 100 MG capsule TAKE 1 CAPSULE BY MOUTH THREE TIMES DAILY 90 capsule 3   hydrochlorothiazide (HYDRODIURIL) 25 MG tablet Take 1 tablet (25 mg total) by mouth daily. 90 tablet 0   Omega-3 Fatty Acids (FISH OIL PO) Take by mouth.     potassium chloride (KLOR-CON) 10 MEQ tablet Take 1 tablet by mouth twice daily 180 tablet 1   SYNTHROID 175 MCG tablet Take 1 tablet by mouth once daily 90 tablet 3   varenicline (CHANTIX CONTINUING MONTH PAK) 1 MG tablet Take 1 tablet (1 mg total) by mouth 2 (two) times daily. 60 tablet 1   No facility-administered medications prior to visit.    No Known Allergies  ROS See HPI    Objective:    Physical Exam Constitutional:      General: She is not in acute distress.    Appearance: Normal appearance. She is well-developed.  HENT:     Head: Normocephalic and  atraumatic.     Right Ear: External ear normal.     Left Ear: External ear normal.  Eyes:     General: No scleral icterus. Neck:     Thyroid: No thyromegaly.  Cardiovascular:     Rate and Rhythm: Normal rate and regular rhythm.     Heart sounds: Normal heart sounds. No murmur heard. Pulmonary:     Effort: Pulmonary effort is  normal. No respiratory distress.     Breath sounds: Normal breath sounds. No wheezing.  Musculoskeletal:     Cervical back: Neck supple.  Skin:    General: Skin is warm and dry.  Neurological:     Mental Status: She is alert and oriented to person, place, and time.  Psychiatric:        Mood and Affect: Mood normal.        Behavior: Behavior normal.        Thought Content: Thought content normal.        Judgment: Judgment normal.      BP 139/76 (BP Location: Right Arm, Patient Position: Sitting, Cuff Size: Large)   Pulse 74   Temp 97.9 F (36.6 C) (Oral)   Resp 16   Ht 5\' 1"  (1.549 m)   Wt 191 lb (86.6 kg)   SpO2 98%   BMI 36.09 kg/m  Wt Readings from Last 3 Encounters:  12/24/22 191 lb (86.6 kg)  11/30/22 193 lb (87.5 kg)  09/14/22 189 lb (85.7 kg)       Assessment & Plan:   Problem List Items Addressed This Visit       Unprioritized   Tobacco abuse     Patient has completed first pack of Chantix and is down to less than half a pack of cigarettes per day. -Continue Chantix x 8 more weeks. Patient to pick up continuation pack from pharmacy.      Prediabetes    Update A1c.       Morbid obesity (HCC)     Patient uncomfortable with weight. Awaiting approval for weight loss injections. -Check labs today including thyroid, kidney function, potassium, and glucose.      Essential hypertension - Primary     Blood pressure in high normal range on Amlodipine 7.5 tablets daily and Hydrochlorothiazide nightly. -Continue current management.      Relevant Orders   Basic metabolic panel   Other Visit Diagnoses     Hypothyroidism,  unspecified type       Relevant Orders   TSH   Hyperglycemia       Relevant Orders   HgB A1c       I am having Anna Pratt maintain her Omega-3 Fatty Acids (FISH OIL PO), Synthroid, gabapentin, betamethasone dipropionate, potassium chloride, varenicline, amLODipine, and hydrochlorothiazide.  No orders of the defined types were placed in this encounter.

## 2022-12-24 NOTE — Assessment & Plan Note (Addendum)
  Patient has completed first pack of Chantix and is down to less than half a pack of cigarettes per day. -Continue Chantix x 8 more weeks. Patient to pick up continuation pack from pharmacy.

## 2022-12-24 NOTE — Assessment & Plan Note (Signed)
  Patient uncomfortable with weight. Awaiting approval for weight loss injections. -Check labs today including thyroid, kidney function, potassium, and glucose.

## 2022-12-24 NOTE — Assessment & Plan Note (Signed)
  Blood pressure in high normal range on Amlodipine 7.5 tablets daily and Hydrochlorothiazide nightly. -Continue current management.

## 2022-12-25 ENCOUNTER — Other Ambulatory Visit: Payer: Self-pay | Admitting: Family

## 2022-12-25 DIAGNOSIS — E039 Hypothyroidism, unspecified: Secondary | ICD-10-CM

## 2022-12-25 NOTE — Telephone Encounter (Signed)
Please confirm that she is taking her synthroid regularly in the AM on an empty stomach.    If so, then we should increase her synthroid to 188 mcg and repeat TSH in 6 weeks. Rx pended below.  Sugar in the borderline diabetes range.  Please continue to work an a healthy reduced carbohydrate diet, exercise and weight loss.

## 2022-12-31 MED ORDER — LEVOTHYROXINE SODIUM 88 MCG PO TABS: 88.0000 ug | ORAL_TABLET | Freq: Every day | ORAL | 1 refills | Status: DC

## 2022-12-31 MED ORDER — LEVOTHYROXINE SODIUM 100 MCG PO TABS: 100.0000 ug | ORAL_TABLET | Freq: Every day | ORAL | 1 refills | Status: DC

## 2022-12-31 NOTE — Telephone Encounter (Signed)
Patient notified of results and new dose. She reported she has been taking the medication on empty stomach first thing in the morning as indicated.

## 2023-01-03 ENCOUNTER — Other Ambulatory Visit: Payer: Self-pay

## 2023-01-03 DIAGNOSIS — E039 Hypothyroidism, unspecified: Secondary | ICD-10-CM

## 2023-01-03 MED ORDER — LEVOTHYROXINE SODIUM 100 MCG PO TABS
100.0000 ug | ORAL_TABLET | Freq: Every day | ORAL | 1 refills | Status: DC
Start: 2023-01-03 — End: 2023-03-22

## 2023-01-14 ENCOUNTER — Telehealth (HOSPITAL_BASED_OUTPATIENT_CLINIC_OR_DEPARTMENT_OTHER): Payer: Self-pay | Admitting: Family

## 2023-01-25 ENCOUNTER — Telehealth: Payer: Self-pay

## 2023-01-25 ENCOUNTER — Telehealth (HOSPITAL_BASED_OUTPATIENT_CLINIC_OR_DEPARTMENT_OTHER): Payer: Self-pay

## 2023-01-25 NOTE — Telephone Encounter (Signed)
Copied from CRM [id number]. Topic: Clinical - Request for Lab/Test Order >> Jan 25, 2023 10:57 AM Marica Otter wrote: Reason for CRM: Trish with [facility], called stating patient has a procedure on 01/02 and authorization expires 12/30. Office will need updated authorization. Please reach out to:  Trish [phone] Ext [phone]

## 2023-01-31 ENCOUNTER — Ambulatory Visit (HOSPITAL_BASED_OUTPATIENT_CLINIC_OR_DEPARTMENT_OTHER)
Admission: RE | Admit: 2023-01-31 | Discharge: 2023-01-31 | Disposition: A | Discharge status: Home / Self Care | Payer: BC Managed Care – PPO | Source: Ambulatory Visit | Attending: Family | Admitting: Family

## 2023-01-31 DIAGNOSIS — Z720 Tobacco use: Secondary | ICD-10-CM | POA: Insufficient documentation

## 2023-02-11 ENCOUNTER — Telehealth: Payer: Self-pay

## 2023-02-11 DIAGNOSIS — R918 Other nonspecific abnormal finding of lung field: Secondary | ICD-10-CM

## 2023-02-11 NOTE — Telephone Encounter (Signed)
 Copied from CRM [id number]. Topic: Clinical - Lab/Test Results >> Feb 11, 2023  1:51 PM Joanell B wrote: Reason for CRM: Ruby called from [facility] regarding a routine CT Scan and wanted to confirm that the scan was received. Callback [phone]

## 2023-02-12 DIAGNOSIS — R918 Other nonspecific abnormal finding of lung field: Secondary | ICD-10-CM | POA: Insufficient documentation

## 2023-02-12 NOTE — Telephone Encounter (Signed)
 Radiology notified results received.

## 2023-02-12 NOTE — Telephone Encounter (Signed)
Patient notified of results and recommendations.     She verbalized understanding.

## 2023-02-12 NOTE — Telephone Encounter (Signed)
 Please advise pt that her CT shows 2 nodules and she she will need to have a follow up CT scan in 6 months.

## 2023-03-13 ENCOUNTER — Other Ambulatory Visit: Payer: Self-pay | Admitting: Family

## 2023-03-13 DIAGNOSIS — I10 Essential (primary) hypertension: Secondary | ICD-10-CM

## 2023-03-22 ENCOUNTER — Other Ambulatory Visit: Payer: Self-pay | Admitting: Family

## 2023-03-22 ENCOUNTER — Telehealth: Payer: Self-pay

## 2023-03-22 DIAGNOSIS — E039 Hypothyroidism, unspecified: Secondary | ICD-10-CM

## 2023-03-22 MED ORDER — LEVOTHYROXINE SODIUM 100 MCG PO TABS
100.0000 ug | ORAL_TABLET | Freq: Every day | ORAL | 0 refills | Status: DC
Start: 2023-03-22 — End: 2023-03-26

## 2023-03-22 NOTE — Telephone Encounter (Signed)
 Received PA for brand name Synthroid, is it documented somewhere that Pt can only take brand name only?

## 2023-03-22 NOTE — Telephone Encounter (Signed)
 Can you please ask the patient if she has had issues with generic synthroid in the past?

## 2023-03-26 ENCOUNTER — Ambulatory Visit (INDEPENDENT_AMBULATORY_CARE_PROVIDER_SITE_OTHER): Payer: BC Managed Care – PPO | Admitting: Family

## 2023-03-26 ENCOUNTER — Other Ambulatory Visit (HOSPITAL_COMMUNITY): Payer: Self-pay

## 2023-03-26 ENCOUNTER — Telehealth: Payer: Self-pay | Admitting: Family

## 2023-03-26 ENCOUNTER — Telehealth: Payer: Self-pay | Admitting: Pharmacy Technician

## 2023-03-26 VITALS — BP 128/65 | HR 79 | Temp 97.6°F | Resp 16 | Ht 61.0 in | Wt 181.0 lb

## 2023-03-26 DIAGNOSIS — Z720 Tobacco use: Secondary | ICD-10-CM

## 2023-03-26 DIAGNOSIS — R7303 Prediabetes: Secondary | ICD-10-CM

## 2023-03-26 DIAGNOSIS — R739 Hyperglycemia, unspecified: Secondary | ICD-10-CM

## 2023-03-26 DIAGNOSIS — E890 Postprocedural hypothyroidism: Secondary | ICD-10-CM | POA: Diagnosis not present

## 2023-03-26 DIAGNOSIS — I10 Essential (primary) hypertension: Secondary | ICD-10-CM | POA: Diagnosis not present

## 2023-03-26 DIAGNOSIS — M1712 Unilateral primary osteoarthritis, left knee: Secondary | ICD-10-CM

## 2023-03-26 LAB — COMPREHENSIVE METABOLIC PANEL
ALT: 17 U/L (ref 0–35)
AST: 19 U/L (ref 0–37)
Albumin: 4.4 g/dL (ref 3.5–5.2)
Alkaline Phosphatase: 71 U/L (ref 39–117)
BUN: 18 mg/dL (ref 6–23)
CO2: 29 meq/L (ref 19–32)
Calcium: 9.3 mg/dL (ref 8.4–10.5)
Chloride: 103 meq/L (ref 96–112)
Creatinine, Ser: 0.75 mg/dL (ref 0.40–1.20)
GFR: 81.99 mL/min (ref >60.00)
Glucose, Bld: 82 mg/dL (ref 70–99)
Potassium: 3.8 meq/L (ref 3.5–5.1)
Sodium: 140 meq/L (ref 135–145)
Total Bilirubin: 0.5 mg/dL (ref 0.2–1.2)
Total Protein: 7.3 g/dL (ref 6.0–8.3)

## 2023-03-26 LAB — TSH: TSH: 0.04 u[IU]/mL — ABNORMAL LOW (ref 0.35–5.50)

## 2023-03-26 LAB — HEMOGLOBIN A1C: Hgb A1c MFr Bld: 6.1 % (ref 4.6–6.5)

## 2023-03-26 MED ORDER — LEVOTHYROXINE SODIUM 175 MCG PO TABS: 175.0000 ug | ORAL_TABLET | Freq: Every day | ORAL | Status: DC

## 2023-03-26 MED ORDER — HYDROCHLOROTHIAZIDE 25 MG PO TABS: 25.0000 mg | ORAL_TABLET | Freq: Every day | ORAL | 1 refills | Status: DC

## 2023-03-26 MED ORDER — POTASSIUM CHLORIDE ER 10 MEQ PO TBCR: 10.0000 meq | EXTENDED_RELEASE_TABLET | Freq: Two times a day (BID) | ORAL | 1 refills | Status: AC

## 2023-03-26 MED ORDER — VARENICLINE TARTRATE 1 MG PO TABS: 1.0000 mg | ORAL_TABLET | Freq: Two times a day (BID) | ORAL | 2 refills | Status: DC

## 2023-03-26 NOTE — Telephone Encounter (Signed)
 Patient reports that she has tried and failed generic in the past.

## 2023-03-26 NOTE — Telephone Encounter (Signed)
 Thyroid testing shows that the 188 is now too strong, I would like for her to go back to the 175 mcg once daily.  I believe she said she has a full bottle of that dose at home. Repeat TSH in 6 weeks.   A1C has come down from 6.3 to 6.1- good work with the weight loss.

## 2023-03-26 NOTE — Assessment & Plan Note (Addendum)
 Lab Results  Component Value Date   HGBA1C 6.3 12/24/2022  She has lost 10 pounds since last visit.

## 2023-03-26 NOTE — Telephone Encounter (Signed)
 Pharmacy Patient Advocate Encounter   Received notification from CoverMyMeds that prior authorization for SYNTHROID TABLETS is required/requested.   Insurance verification completed.   The patient is insured through [facility] .   Per test claim:  LEVOTHYROXINE 100 MCG TABLETS is preferred by the insurance.  If suggested medication is appropriate, Please send in a new RX and discontinue this one. If not, please advise as to why it's not appropriate so that we may request a Prior Authorization. Please note, some preferred medications may still require a PA.  If the suggested medications have not been trialed and there are no contraindications to their use, the PA will not be submitted, as it will not be approved.  Can pt take the generic? Need documentation of trail/failure/intolerance to generic.

## 2023-03-26 NOTE — Telephone Encounter (Signed)
 Pharmacy Patient Advocate Encounter   Received notification from CoverMyMeds that prior authorization for SYNTHROID TABLETS is required/requested.   Insurance verification completed.   The patient is insured through [facility] .   Per test claim: PA required; PA submitted to above mentioned insurance via CoverMyMeds Key/confirmation #/EOC [id number] Status is pending

## 2023-03-26 NOTE — Assessment & Plan Note (Signed)
 Reports that she is smoking a "cigarette here and there."

## 2023-03-26 NOTE — Progress Notes (Signed)
 Subjective:     Patient ID: Anna Pratt, female    DOB: 03-25-1955, 68 y.o.   MRN: [id number]  Chief Complaint  Patient presents with   Hypertension    Here for follow up   Hypothyroidism    Here for follow up    Hypertension    Discussed the use of AI scribe software for clinical note transcription with the patient, who gave verbal consent to proceed.  History of Present Illness   Anna Pratt is a 68 year old female with hypothyroidism and hypertension who presents for a follow-up visit.  Her thyroid medication was recently adjusted from 175 mcg to 188 mcg. She prefers the brand name medication over generic due to perceived differences in efficacy. She hopes the new dosage will stabilize her thyroid levels.  She manages her blood pressure with amlodipine and hydrochlorothiazide. She realized she was taking only one tablet of amlodipine instead of the prescribed one and a half tablets and plans to adjust her dosage. Her initial blood pressure reading was slightly elevated, but home readings are generally within normal range when taking the correct dosage. She also takes potassium supplements.  Her HbA1c levels have increased slightly over the past year, with the most recent reading at 6.3, up from previous values of 5.9 and 6.1. She wants to avoid diabetes due to her dislike of needles and has expressed interest in weight loss medications but is concerned about the cost and insurance coverage.  She is using Chantix to aid in smoking cessation and smokes occasionally, particularly after meals.  She has noticed a weight loss from 191 lbs to 181 lbs, attributed to dietary changes and increased physical activity. She has a history of knee pain due to a meniscus tear, for which she takes Tylenol as needed. She plans to resume regular gym visits after a recent move.  A recent lung cancer screening CT revealed a small nodule in the right lower lobe, measuring  7.1 mm.     Wt Readings from Last 3 Encounters:  03/26/23 181 lb (82.1 kg)  12/24/22 191 lb (86.6 kg)  11/30/22 193 lb (87.5 kg)        Health Maintenance Due  Topic Date Due   COVID-19 Vaccine (9 - 2024-25 season) 12/31/2022    Past Medical History:  Diagnosis Date   Abnormal Pap smear of cervix    02-26-20   Cataract    Hypertension    Kidney stone    Osteoarthritis, knee    Thyroid goiter    Tobacco abuse 08/07/2022    Past Surgical History:  Procedure Laterality Date   ANKLE ARTHROPLASTY Right 2019   done after injury   ANKLE SURGERY Left    s/p injury   CESAREAN SECTION     KIDNEY STONE SURGERY     KNEE SURGERY Right    THYROID SURGERY     due to goiter 32 years ago    Family History  Problem Relation Age of Onset   Hypertension Father    Heart attack Father    Bone cancer Sister 59   Diabetes Maternal Grandmother     Social History   Socioeconomic History   Marital status: Single    Spouse name: Not on file   Number of children: Not on file   Years of education: Not on file   Highest education level: Some college, no degree  Occupational History   Not on file  Tobacco Use   Smoking  status: Every Day    Current packs/day: 0.00    Average packs/day: 0.5 packs/day for 51.4 years (25.7 ttl pk-yrs)    Types: Cigarettes    Start date: 19    Last attempt to quit: 06/21/2019    Years since quitting: 3.7   Smokeless tobacco: Never  Substance and Sexual Activity   Alcohol use: Yes    Comment: socially   Drug use: Never   Sexual activity: Not Currently    Partners: Male    Birth control/protection: Post-menopausal  Other Topics Concern   Not on file  Social History Narrative   Works 3rd shift at [employer] ([employer]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) used to work at [employer]   Here from [place] to be near daughter and grandson   Lives with daughter   Single   Completed 1 year of college and some Scientist, 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   Enjoys television   Social  Drivers of Corporate investment banker Strain: Low Risk  (11/29/2022)   Overall Financial Resource Strain (CARDIA)    Difficulty of Paying Living Expenses: Not hard at all  Food Insecurity: No Food Insecurity (11/29/2022)   Hunger Vital Sign    Worried About Running Out of Food in the Last Year: Never true    Ran Out of Food in the Last Year: Never true  Transportation Needs: No Transportation Needs (11/29/2022)   PRAPARE - Administrator, Civil Service (Medical): No    Lack of Transportation (Non-Medical): No  Physical Activity: Insufficiently Active (11/29/2022)   Exercise Vital Sign    Days of Exercise per Week: 3 days    Minutes of Exercise per Session: 30 min  Stress: No Stress Concern Present (11/29/2022)   [employer] of Occupational Health - Occupational Stress Questionnaire    Feeling of Stress : Not at all  Social Connections: Socially Isolated (11/29/2022)   Social Connection and Isolation Panel [NHANES]    Frequency of Communication with Friends and Family: Once a week    Frequency of Social Gatherings with Friends and Family: Once a week    Attends Religious Services: Never    Database administrator or Organizations: Yes    Attends Engineer, structural: More than 4 times per year    Marital Status: Never married  Catering manager Violence: Not on file    Outpatient Medications Prior to Visit  Medication Sig Dispense Refill   amLODipine (NORVASC) 5 MG tablet TAKE 1 & 1/2 (ONE & ONE-HALF) TABLETS BY MOUTH ONCE DAILY 135 tablet 0   betamethasone dipropionate 0.05 % cream Apply topically 2 (two) times daily. 30 g 0   gabapentin (NEURONTIN) 100 MG capsule TAKE 1 CAPSULE BY MOUTH THREE TIMES DAILY 90 capsule 3   levothyroxine (SYNTHROID) 100 MCG tablet Take 1 tablet (100 mcg total) by mouth daily before breakfast. Take with 88 mcg tab 30 tablet 0   Omega-3 Fatty Acids (FISH OIL PO) Take by mouth.     SYNTHROID 88 MCG tablet Take 1 tablet (88 mcg  total) by mouth daily before breakfast. Take with tab 30 tablet 0   hydrochlorothiazide (HYDRODIURIL) 25 MG tablet Take 1 tablet (25 mg total) by mouth daily. 90 tablet 0   potassium chloride (KLOR-CON) 10 MEQ tablet Take 1 tablet by mouth twice daily 180 tablet 1   varenicline (CHANTIX CONTINUING MONTH PAK) 1 MG tablet Take 1 tablet (1 mg total) by mouth 2 (two) times daily. 60 tablet 1  No facility-administered medications prior to visit.    No Known Allergies  ROS See HPI    Objective:    Physical Exam Constitutional:      General: She is not in acute distress.    Appearance: Normal appearance. She is well-developed.  HENT:     Head: Normocephalic and atraumatic.     Right Ear: External ear normal.     Left Ear: External ear normal.  Eyes:     General: No scleral icterus. Neck:     Thyroid: No thyromegaly.  Cardiovascular:     Rate and Rhythm: Normal rate and regular rhythm.     Heart sounds: Normal heart sounds. No murmur heard. Pulmonary:     Effort: Pulmonary effort is normal. No respiratory distress.     Breath sounds: Normal breath sounds. No wheezing.  Musculoskeletal:        General: No swelling.     Cervical back: Neck supple.  Skin:    General: Skin is warm and dry.  Neurological:     Mental Status: She is alert and oriented to person, place, and time.  Psychiatric:        Mood and Affect: Mood normal.        Behavior: Behavior normal.        Thought Content: Thought content normal.        Judgment: Judgment normal.      BP 128/65   Pulse 79   Temp 97.6 F (36.4 C) (Oral)   Resp 16   Ht 5\' 1"  (1.549 m)   Wt 181 lb (82.1 kg)   SpO2 98%   BMI 34.20 kg/m  Wt Readings from Last 3 Encounters:  03/26/23 181 lb (82.1 kg)  12/24/22 191 lb (86.6 kg)  11/30/22 193 lb (87.5 kg)       Assessment & Plan:   Problem List Items Addressed This Visit       Unprioritized   Tobacco abuse   Reports that she is smoking a "cigarette here and  there."       Relevant Medications   varenicline (CHANTIX CONTINUING MONTH PAK) 1 MG tablet   Prediabetes   Lab Results  Component Value Date   HGBA1C 6.3 12/24/2022  She has lost 10 pounds since last visit.        Post-surgical hypothyroidism   Update TSH- last visit we increased synthroid from 175 mcg to + 88 mcg. Update TSH.       Relevant Orders   TSH   Osteoarthritis of left knee   Knee pain has not been as severe since she has lost some weight.        Essential hypertension   Only taking 5mg  amlodipine + hydrochlorothiazide. She will increase amlodipine from 5mg  to 7.5mg .        Relevant Medications   hydrochlorothiazide (HYDRODIURIL) 25 MG tablet   Other Visit Diagnoses       Hyperglycemia    -  Primary   Relevant Orders   HgB A1c   Comp Met (CMET)       I have changed Earlyn M. Renteria's potassium chloride. I am also having her maintain her Omega-3 Fatty Acids (FISH OIL PO), gabapentin, betamethasone dipropionate, amLODipine, Synthroid, levothyroxine, varenicline, and hydrochlorothiazide.  Meds ordered this encounter  Medications   varenicline (CHANTIX CONTINUING MONTH PAK) 1 MG tablet    Sig: Take 1 tablet (1 mg total) by mouth 2 (two) times daily.    Dispense:  60  tablet    Refill:  2    Supervising Provider:   Danise Edge A [[id number]]   potassium chloride (KLOR-CON) 10 MEQ tablet    Sig: Take 1 tablet (10 mEq total) by mouth 2 (two) times daily.    Dispense:  180 tablet    Refill:  1    Supervising Provider:   Danise Edge A [[id number]]   hydrochlorothiazide (HYDRODIURIL) 25 MG tablet    Sig: Take 1 tablet (25 mg total) by mouth daily.    Dispense:  90 tablet    Refill:  1    Supervising Provider:   Danise Edge A [[id number]]

## 2023-03-26 NOTE — Assessment & Plan Note (Signed)
 Knee pain has not been as severe since she has lost some weight.

## 2023-03-26 NOTE — Assessment & Plan Note (Signed)
 Only taking 5mg  amlodipine + hydrochlorothiazide. She will increase amlodipine from 5mg  to 7.5mg .

## 2023-03-26 NOTE — Assessment & Plan Note (Signed)
 Update TSH- last visit we increased synthroid from 175 mcg to + 88 mcg. Update TSH.

## 2023-03-27 NOTE — Telephone Encounter (Signed)
 Pharmacy Patient Advocate Encounter  Received notification from [facility] that Prior Authorization for SYNTHROID TABLETS has been DENIED.  Full denial letter will be uploaded to the media tab. See denial reason below.   PA #/Case ID/Reference #: [id number]  Need further documentation in chart as to why patient cannot take the generic.

## 2023-03-28 ENCOUNTER — Encounter: Payer: Self-pay | Admitting: Family

## 2023-03-28 NOTE — Telephone Encounter (Signed)
 I don't think so- I will send the generic. tks

## 2023-03-28 NOTE — Telephone Encounter (Signed)
 Would PA still be warranted?

## 2023-03-28 NOTE — Telephone Encounter (Signed)
 Patient was notified of results and provider's comments. She was scheduled to come in for tsh 05/10/23

## 2023-03-29 ENCOUNTER — Encounter: Payer: Self-pay | Admitting: Pharmacy Technician

## 2023-03-29 ENCOUNTER — Other Ambulatory Visit (HOSPITAL_COMMUNITY): Payer: Self-pay

## 2023-03-29 ENCOUNTER — Telehealth: Payer: Self-pay | Admitting: Pharmacy Technician

## 2023-03-29 NOTE — Telephone Encounter (Signed)
 ERROR

## 2023-03-29 NOTE — Telephone Encounter (Signed)
 Pharmacy Patient Advocate Encounter   Received notification from CoverMyMeds that prior authorization for VARENICLINE 1MG  TABLET is required/requested.   Insurance verification completed.   The patient is insured through [facility] .   Per test claim:

## 2023-04-03 ENCOUNTER — Telehealth: Payer: Self-pay

## 2023-04-03 ENCOUNTER — Other Ambulatory Visit (HOSPITAL_COMMUNITY): Payer: Self-pay

## 2023-04-03 NOTE — Telephone Encounter (Signed)
 Pharmacy Patient Advocate Encounter  Received notification from [facility] that Prior Authorization for Synthroid tablets NO PRIOR IS NEEDED AT THIS TIME DUE TO. Premium Drug Exclusion: Drug Not Covered-Contact Prescriber         100% patient pay applies at retail       unless filled at a [facility]. Plan Exclusion                           Form Alt: EUTHYROX     TAB         Form Alt: LEVO-T       TAB         Form Alt: LEVOTHYROXIN TAB 88MCG*eVouche r*AbbVie, the mfg of SYNTHROID 88 MCG TABLET, paid 16.41 toward the cost of this  prescription.   Prior Authorization for {Ran test claim, Copay is $39.50 This test claim was processed through [facility]- copay amounts may vary at other pharmacies due to pharmacy/plan contracts, or as the patient moves through the different stages of their insurance plan.   KEY: [id number]

## 2023-04-08 ENCOUNTER — Other Ambulatory Visit (HOSPITAL_COMMUNITY): Payer: Self-pay

## 2023-04-12 ENCOUNTER — Other Ambulatory Visit: Payer: BC Managed Care – PPO

## 2023-04-22 ENCOUNTER — Other Ambulatory Visit: Payer: Self-pay | Admitting: Family Medicine

## 2023-05-10 ENCOUNTER — Other Ambulatory Visit: Payer: BC Managed Care – PPO

## 2023-05-15 ENCOUNTER — Other Ambulatory Visit (INDEPENDENT_AMBULATORY_CARE_PROVIDER_SITE_OTHER)

## 2023-05-15 DIAGNOSIS — E890 Postprocedural hypothyroidism: Secondary | ICD-10-CM | POA: Diagnosis not present

## 2023-05-15 LAB — TSH: TSH: 0.09 u[IU]/mL — ABNORMAL LOW (ref 0.35–5.50)

## 2023-05-16 ENCOUNTER — Other Ambulatory Visit: Payer: Self-pay

## 2023-05-16 ENCOUNTER — Telehealth: Payer: Self-pay | Admitting: Family

## 2023-05-16 MED ORDER — LEVOTHYROXINE SODIUM 150 MCG PO TABS: 150.0000 ug | ORAL_TABLET | Freq: Every day | ORAL | 0 refills | Status: DC

## 2023-05-16 NOTE — Telephone Encounter (Signed)
 Patient notified of results and medication dose change. She will follow up on her scheduled appointment 06/15/23

## 2023-05-16 NOTE — Telephone Encounter (Signed)
 Please advise pt synthroid still too strong. Decrease synthroid from 175 to 150 mcg, and repeat tsh in 6 weeks.

## 2023-06-25 ENCOUNTER — Ambulatory Visit (INDEPENDENT_AMBULATORY_CARE_PROVIDER_SITE_OTHER): Payer: BC Managed Care – PPO | Admitting: Family

## 2023-06-25 VITALS — BP 137/67 | HR 79 | Temp 97.8°F | Resp 16 | Ht 61.0 in | Wt 178.0 lb

## 2023-06-25 DIAGNOSIS — E890 Postprocedural hypothyroidism: Secondary | ICD-10-CM

## 2023-06-25 DIAGNOSIS — R7303 Prediabetes: Secondary | ICD-10-CM | POA: Diagnosis not present

## 2023-06-25 DIAGNOSIS — Z720 Tobacco use: Secondary | ICD-10-CM

## 2023-06-25 DIAGNOSIS — I10 Essential (primary) hypertension: Secondary | ICD-10-CM

## 2023-06-25 LAB — BASIC METABOLIC PANEL WITH GFR
BUN: 22 mg/dL (ref 6–23)
CO2: 27 meq/L (ref 19–32)
Calcium: 9.9 mg/dL (ref 8.4–10.5)
Chloride: 101 meq/L (ref 96–112)
Creatinine, Ser: 0.72 mg/dL (ref 0.40–1.20)
GFR: 85.96 mL/min (ref >60.00)
Glucose, Bld: 77 mg/dL (ref 70–99)
Potassium: 4.1 meq/L (ref 3.5–5.1)
Sodium: 139 meq/L (ref 135–145)

## 2023-06-25 LAB — TSH: TSH: 0.28 u[IU]/mL — ABNORMAL LOW (ref 0.35–5.50)

## 2023-06-25 LAB — HEMOGLOBIN A1C: Hgb A1c MFr Bld: 6 % (ref 4.6–6.5)

## 2023-06-25 MED ORDER — HYDROCHLOROTHIAZIDE 25 MG PO TABS: 25.0000 mg | ORAL_TABLET | Freq: Every day | ORAL | 1 refills | Status: AC

## 2023-06-25 MED ORDER — AMLODIPINE BESYLATE 5 MG PO TABS: 7.5000 mg | ORAL_TABLET | Freq: Every day | ORAL | 1 refills | Status: AC

## 2023-06-25 NOTE — Assessment & Plan Note (Addendum)
 Has cut back.  She is continuing to work on quitting.  Encouraged her to try chantix  and let me know how she does on this.

## 2023-06-25 NOTE — Assessment & Plan Note (Signed)
 BP stable on hydrochlorothiazide  + amlodipine  7.5mg . Continue same.

## 2023-06-25 NOTE — Assessment & Plan Note (Signed)
 Lab Results  Component Value Date   TSH 0.09 (L) 05/15/2023   Adjusted synthroid  last visit.  Update TSH.

## 2023-06-25 NOTE — Progress Notes (Signed)
 Subjective:     Patient ID: Anna Pratt, female    DOB: 01-15-1956, 68 y.o.   MRN: [id number]  Chief Complaint  Patient presents with   Hypertension    Here for follow up   Hypothyroidism    Here for follow up    Hypertension    Discussed the use of AI scribe software for clinical note transcription with the patient, who gave verbal consent to proceed.  History of Present Illness Anna Pratt is a 68 year old female with hypertension, prediabetes, and hypothyroidism who presents for a routine follow-up visit. She has no new concerns at this time. Her last hemoglobin A1c was 6.1. She has lost 13 pounds since November, now weighing 178 pounds, and aims to get below 170 pounds. Her knees feel better with the weight loss. She experiences muscle cramps sometimes in her legs which are relieved by pickle juice, and acknowledges the need to increase water intake. She works third shift and is attempting to quit smoking. She has a prescription for Chantix  but has not yet tried it. She is currently taking amlodipine  5mg , 1.5 tablets daily as well as hydrochlorothiazide  for blood pressure and requests refills for these medications. Her eczema is not currently bothersome and is managed with steroid cream as needed. She is on levothyroxine  for her thyroid  condition and does not need a refill at this time.       Health Maintenance Due  Topic Date Due   COVID-19 Vaccine (9 - 2024-25 season) 12/31/2022    Past Medical History:  Diagnosis Date   Abnormal Pap smear of cervix    02-26-20   Cataract    Hypertension    Kidney stone    Osteoarthritis, knee    Thyroid  goiter    Tobacco abuse 08/07/2022    Past Surgical History:  Procedure Laterality Date   ANKLE ARTHROPLASTY Right 2019   done after injury   ANKLE SURGERY Left    s/p injury   CESAREAN SECTION     KIDNEY STONE SURGERY     KNEE SURGERY Right    THYROID  SURGERY     due to goiter 32 years ago     Family History  Problem Relation Age of Onset   Hypertension Father    Heart attack Father    Bone cancer Sister 41   Diabetes Maternal Grandmother     Social History   Socioeconomic History   Marital status: Single    Spouse name: Not on file   Number of children: Not on file   Years of education: Not on file   Highest education level: Some college, no degree  Occupational History   Not on file  Tobacco Use   Smoking status: Every Day    Current packs/day: 0.00    Average packs/day: 0.5 packs/day for 51.4 years (25.7 ttl pk-yrs)    Types: Cigarettes    Start date: 39    Last attempt to quit: 06/21/2019    Years since quitting: 4.0   Smokeless tobacco: Never  Substance and Sexual Activity   Alcohol use: Yes    Comment: socially   Drug use: Never   Sexual activity: Not Currently    Partners: Male    Birth control/protection: Post-menopausal  Other Topics Concern   Not on file  Social History Narrative   Works 3rd shift at [employer] ([employer]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) used to work at [employer]   Here from [place] to be near  daughter and grandson   Lives with daughter   Single   Completed 1 year of college and some technical school   Enjoys television   Social Drivers of Health   Financial Resource Strain: Low Risk  (06/25/2023)   Overall Financial Resource Strain (CARDIA)    Difficulty of Paying Living Expenses: Not hard at all  Food Insecurity: No Food Insecurity (06/25/2023)   Hunger Vital Sign    Worried About Running Out of Food in the Last Year: Never true    Ran Out of Food in the Last Year: Never true  Transportation Needs: No Transportation Needs (06/25/2023)   PRAPARE - Administrator, Civil Service (Medical): No    Lack of Transportation (Non-Medical): No  Physical Activity: Sufficiently Active (06/25/2023)   Exercise Vital Sign    Days of Exercise per Week: 5 days    Minutes of Exercise per Session: 30 min  Stress: No Stress  Concern Present (06/25/2023)   [employer] of Occupational Health - Occupational Stress Questionnaire    Feeling of Stress : Not at all  Social Connections: Moderately Integrated (06/25/2023)   Social Connection and Isolation Panel [NHANES]    Frequency of Communication with Friends and Family: Three times a week    Frequency of Social Gatherings with Friends and Family: Once a week    Attends Religious Services: 1 to 4 times per year    Active Member of [employer] or Organizations: Yes    Attends Banker Meetings: 1 to 4 times per year    Marital Status: Never married  Intimate Partner Violence: Not on file    Outpatient Medications Prior to Visit  Medication Sig Dispense Refill   betamethasone  dipropionate 0.05 % cream Apply topically 2 (two) times daily. 30 g 0   gabapentin  (NEURONTIN ) 100 MG capsule TAKE 1 CAPSULE BY MOUTH THREE TIMES DAILY 90 capsule 3   levothyroxine  (SYNTHROID ) 150 MCG tablet Take 1 tablet (150 mcg total) by mouth daily. 90 tablet 0   Omega-3 Fatty Acids (FISH OIL PO) Take by mouth.     potassium chloride  (KLOR-CON ) 10 MEQ tablet Take 1 tablet (10 mEq total) by mouth 2 (two) times daily. 180 tablet 1   varenicline  (CHANTIX  CONTINUING MONTH PAK) 1 MG tablet Take 1 tablet (1 mg total) by mouth 2 (two) times daily. 60 tablet 2   amLODipine  (NORVASC ) 5 MG tablet TAKE 1 & 1/2 (ONE & ONE-HALF) TABLETS BY MOUTH ONCE DAILY 135 tablet 0   hydrochlorothiazide  (HYDRODIURIL ) 25 MG tablet Take 1 tablet (25 mg total) by mouth daily. 90 tablet 1   No facility-administered medications prior to visit.    No Known Allergies  ROS    See HPI Objective:     Physical Exam Constitutional:      General: She is not in acute distress.    Appearance: Normal appearance. She is well-developed.  HENT:     Head: Normocephalic and atraumatic.     Right Ear: External ear normal.     Left Ear: External ear normal.  Eyes:     General: No scleral icterus. Neck:      Thyroid : No thyromegaly.  Cardiovascular:     Rate and Rhythm: Normal rate and regular rhythm.     Heart sounds: Normal heart sounds. No murmur heard. Pulmonary:     Effort: Pulmonary effort is normal. No respiratory distress.     Breath sounds: Normal breath sounds. No wheezing.  Musculoskeletal:  Cervical back: Neck supple.  Skin:    General: Skin is warm and dry.  Neurological:     Mental Status: She is alert and oriented to person, place, and time.  Psychiatric:        Mood and Affect: Mood normal.        Behavior: Behavior normal.        Thought Content: Thought content normal.        Judgment: Judgment normal.      BP 137/67 (BP Location: Right Arm, Patient Position: Sitting, Cuff Size: Normal)   Pulse 79   Temp 97.8 F (36.6 C) (Oral)   Resp 16   Ht 5\' 1"  (1.549 m)   Wt 178 lb (80.7 kg)   SpO2 98%   BMI 33.63 kg/m  Wt Readings from Last 3 Encounters:  06/25/23 178 lb (80.7 kg)  03/26/23 181 lb (82.1 kg)  12/24/22 191 lb (86.6 kg)       Assessment & Plan:   Problem List Items Addressed This Visit       Unprioritized   Tobacco abuse   Has cut back.  She is continuing to work on quitting.  Encouraged her to try chantix  and let me know how she does on this.       Prediabetes - Primary   Lab Results  Component Value Date   HGBA1C 6.1 03/26/2023   HGBA1C 6.3 12/24/2022   HGBA1C 5.9 08/07/2022   Lab Results  Component Value Date   LDLCALC 105 (H) 05/26/2021   CREATININE 0.75 03/26/2023   Wt Readings from Last 3 Encounters:  06/25/23 178 lb (80.7 kg)  03/26/23 181 lb (82.1 kg)  12/24/22 191 lb (86.6 kg)  She continues to work on weight loss. A1C stable. Update today.        Relevant Orders   HgB A1c   Post-surgical hypothyroidism   Lab Results  Component Value Date   TSH 0.09 (L) 05/15/2023   Adjusted synthroid  last visit.  Update TSH.       Relevant Orders   TSH   Essential hypertension   BP stable on hydrochlorothiazide  +  amlodipine  7.5mg . Continue same.       Relevant Medications   amLODipine  (NORVASC ) 5 MG tablet   hydrochlorothiazide  (HYDRODIURIL ) 25 MG tablet   Other Relevant Orders   Basic Metabolic Panel (BMET)    I have changed Mkenzie M. Lookabaugh's amLODipine . I am also having her maintain her Omega-3 Fatty Acids (FISH OIL PO), gabapentin , betamethasone  dipropionate, varenicline , potassium chloride , levothyroxine , and hydrochlorothiazide .  Meds ordered this encounter  Medications   amLODipine  (NORVASC ) 5 MG tablet    Sig: Take 1.5 tablets (7.5 mg total) by mouth daily at 6 (six) AM.    Dispense:  135 tablet    Refill:  1    Supervising Provider:   Randie Bustle A [[id number]]   hydrochlorothiazide  (HYDRODIURIL ) 25 MG tablet    Sig: Take 1 tablet (25 mg total) by mouth daily.    Dispense:  90 tablet    Refill:  1    Supervising Provider:   Randie Bustle A [[id number]]

## 2023-06-25 NOTE — Patient Instructions (Signed)
 VISIT SUMMARY:  Today, you had a routine follow-up visit to monitor your prediabetes, hypertension, and hypothyroidism. You have successfully lost 13 pounds since November, which has helped your knees feel better. We discussed your current medications, smoking cessation, and general health maintenance.  YOUR PLAN:  PREDIABETES: Your hemoglobin A1c is 6.1, indicating prediabetes. Your recent weight loss is beneficial for managing this condition. -We will order blood work to check your glucose levels.  HYPERTENSION: Your blood pressure is well-controlled with your current medications. -We will prescribe amlodipine  for six months. -We will refill your hydrochlorothiazide  prescription.  LUNG NODULE FOLLOW-UP: You will need a follow-up CT scan to monitor a lung nodule in August. -We will order a CT scan in August to check on the lung nodule.  SMOKING CESSATION COUNSELING: We discussed using Chantix  to help you quit smoking. -Consider starting the 12-week Chantix  course for smoking cessation. -Follow up in three weeks if you start Chantix .  GENERAL HEALTH MAINTENANCE: You are up to date with your pneumonia vaccination. We discussed the importance of annual flu shots and COVID boosters. -Get your influenza vaccination and COVID booster in September.

## 2023-06-25 NOTE — Assessment & Plan Note (Signed)
 Lab Results  Component Value Date   HGBA1C 6.1 03/26/2023   HGBA1C 6.3 12/24/2022   HGBA1C 5.9 08/07/2022   Lab Results  Component Value Date   LDLCALC 105 (H) 05/26/2021   CREATININE 0.75 03/26/2023   Wt Readings from Last 3 Encounters:  06/25/23 178 lb (80.7 kg)  03/26/23 181 lb (82.1 kg)  12/24/22 191 lb (86.6 kg)  She continues to work on weight loss. A1C stable. Update today.

## 2023-06-26 ENCOUNTER — Telehealth: Payer: Self-pay | Admitting: Family

## 2023-06-26 DIAGNOSIS — E039 Hypothyroidism, unspecified: Secondary | ICD-10-CM

## 2023-06-26 MED ORDER — LEVOTHYROXINE SODIUM 125 MCG PO TABS: 125.0000 ug | ORAL_TABLET | Freq: Every day | ORAL | 0 refills | Status: DC

## 2023-06-26 NOTE — Telephone Encounter (Signed)
 Please advise pt that thyroid  test is improving but synthroid  is still a little strong. Please decrease levothyroxine  from 150 mcg go 125 mcg and repeat TSH in 6 weeks.

## 2023-06-27 NOTE — Telephone Encounter (Signed)
 Patient notified of results, new medication dose and recommendations. She was scheduled to come back in July for [facility]

## 2023-06-27 NOTE — Addendum Note (Signed)
 Addended by: Dorrene Gaucher on: 06/27/2023 02:22 PM   Modules accepted: Level of Service

## 2023-06-28 ENCOUNTER — Other Ambulatory Visit

## 2023-07-15 ENCOUNTER — Telehealth: Payer: Self-pay | Admitting: Family

## 2023-07-15 DIAGNOSIS — R911 Solitary pulmonary nodule: Secondary | ICD-10-CM

## 2023-07-20 NOTE — Telephone Encounter (Signed)
 Please advise pt that she is due for follow up CT to recheck lung nodule.  Order has been placed.

## 2023-07-22 NOTE — Telephone Encounter (Signed)
 Called patient but no answer, lvm for patient to be aware of CT order

## 2023-07-29 ENCOUNTER — Telehealth: Payer: Self-pay | Admitting: Family

## 2023-07-29 DIAGNOSIS — R911 Solitary pulmonary nodule: Secondary | ICD-10-CM

## 2023-07-29 NOTE — Telephone Encounter (Signed)
 Please advise pt that she is due for follow up lung CT to recheck lung nodule.  Order has been placed.

## 2023-07-30 NOTE — Telephone Encounter (Signed)
 Patient notified she will get a call from radiology to set up appointment for ct

## 2023-08-16 ENCOUNTER — Ambulatory Visit: Payer: Self-pay | Admitting: Family

## 2023-08-16 ENCOUNTER — Other Ambulatory Visit (INDEPENDENT_AMBULATORY_CARE_PROVIDER_SITE_OTHER)

## 2023-08-16 ENCOUNTER — Other Ambulatory Visit: Payer: Self-pay | Admitting: Family

## 2023-08-16 DIAGNOSIS — E039 Hypothyroidism, unspecified: Secondary | ICD-10-CM | POA: Diagnosis not present

## 2023-08-16 LAB — TSH: TSH: 2.41 u[IU]/mL (ref 0.35–5.50)

## 2023-08-16 MED ORDER — LEVOTHYROXINE SODIUM 125 MCG PO TABS: 125.0000 ug | ORAL_TABLET | Freq: Every day | ORAL | 1 refills | Status: DC

## 2023-08-30 ENCOUNTER — Telehealth: Payer: Self-pay | Admitting: Family

## 2023-08-30 NOTE — Telephone Encounter (Signed)
 See Mychart.

## 2023-09-03 ENCOUNTER — Other Ambulatory Visit: Payer: Self-pay | Admitting: Family Medicine

## 2023-09-03 DIAGNOSIS — M5441 Lumbago with sciatica, right side: Secondary | ICD-10-CM

## 2023-09-05 ENCOUNTER — Telehealth: Payer: Self-pay

## 2023-09-05 ENCOUNTER — Other Ambulatory Visit: Payer: Self-pay

## 2023-09-05 DIAGNOSIS — M5441 Lumbago with sciatica, right side: Secondary | ICD-10-CM

## 2023-09-05 MED ORDER — GABAPENTIN 100 MG PO CAPS
100.0000 mg | ORAL_CAPSULE | Freq: Three times a day (TID) | ORAL | 3 refills | Status: AC
Start: 2023-09-05 — End: ?

## 2023-09-05 NOTE — Telephone Encounter (Signed)
 Prescription sent as per last ov with pcp Eleanor Ponto NP in May of 2025. She will continue patient on this medication

## 2023-09-05 NOTE — Telephone Encounter (Signed)
 Copied from CRM #[id number]. Topic: Clinical - Medication Question >> Sep 05, 2023  8:59 AM Cleave MATSU wrote: Reason for CRM: pt needs more info on why her gabapentin  was denied I informed her that the message said she;s no longer under provider care and she's confused because she said she see eleanor and not betty [place] anymore.

## 2023-11-08 IMAGING — DX DG LUMBAR SPINE COMPLETE 4+V
5 series · 5 of 5 positions shown · non-contrast
Comparison: None Available.

CLINICAL DATA: Right radiculopathy

EXAM:
LUMBAR SPINE - COMPLETE 4+ VIEW

[lumbar spine ap]
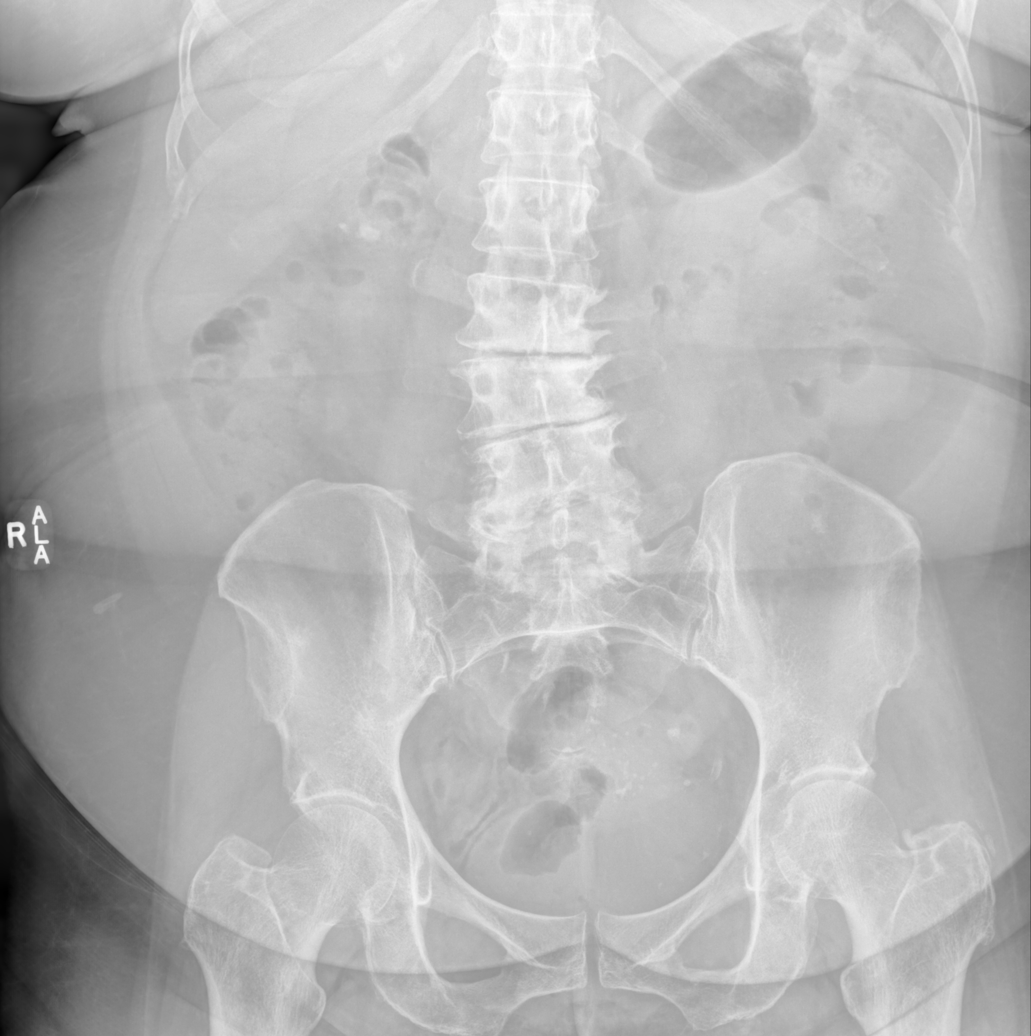

[lumbar spine oblique (1 of 2)]
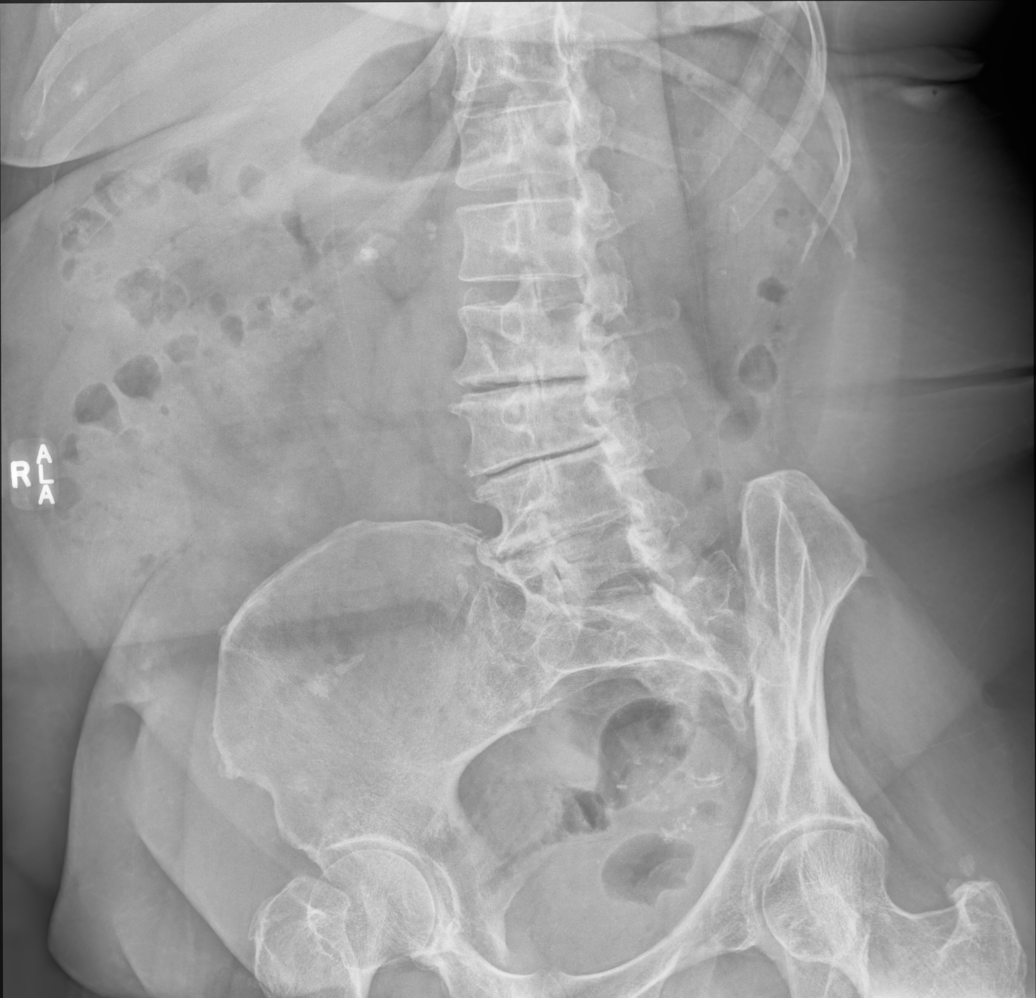

[lumbar spine oblique (2 of 2)]
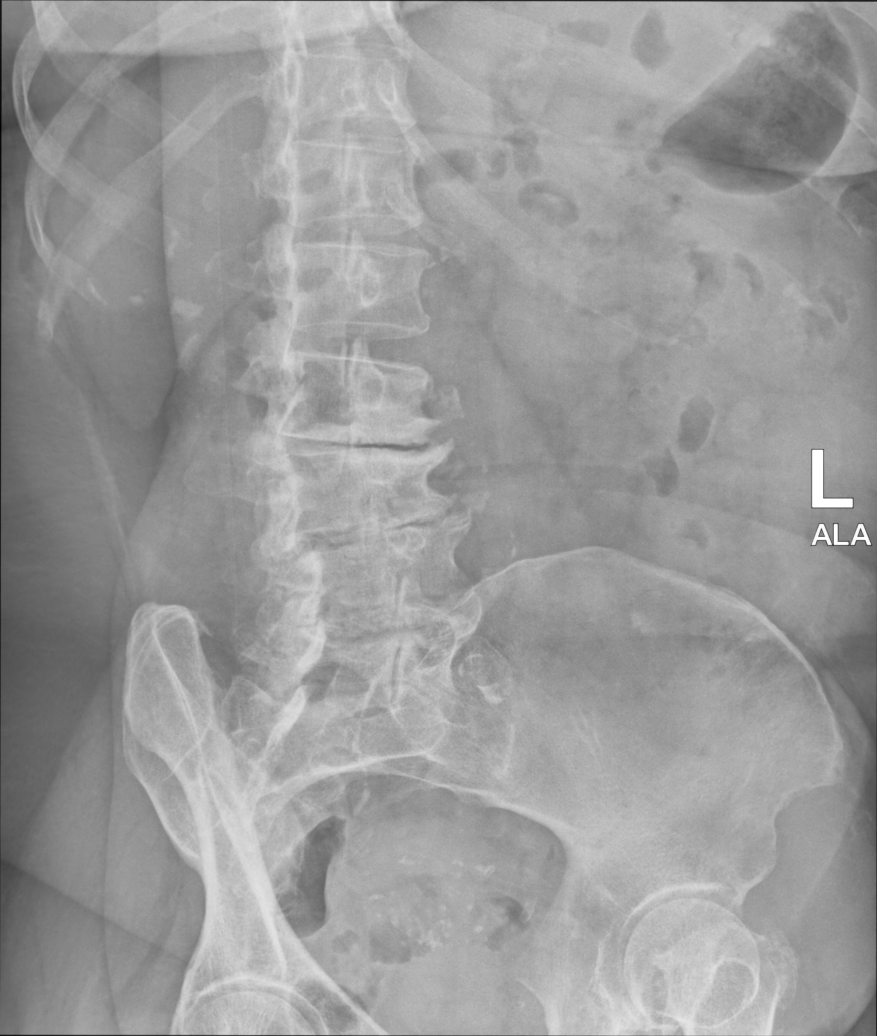

[lumbar spine lat (1 of 2)]
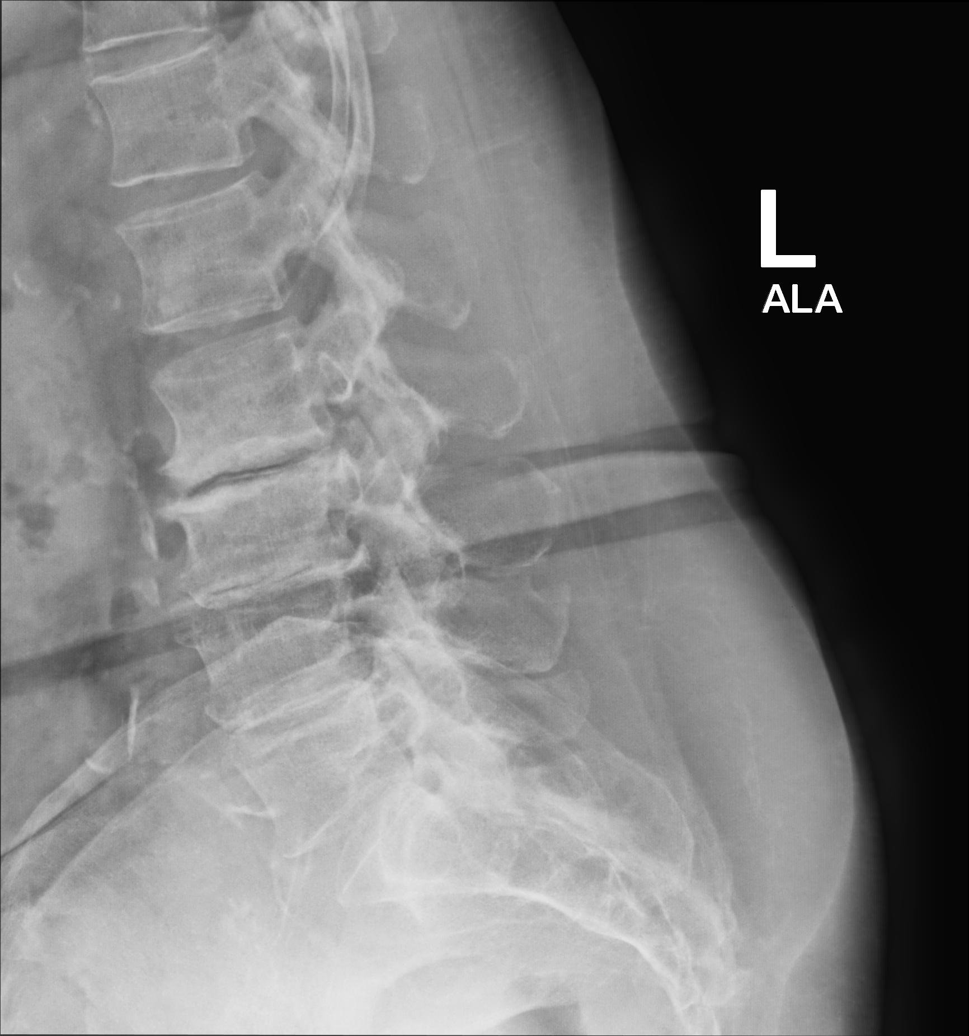

[lumbar spine lat (2 of 2)]
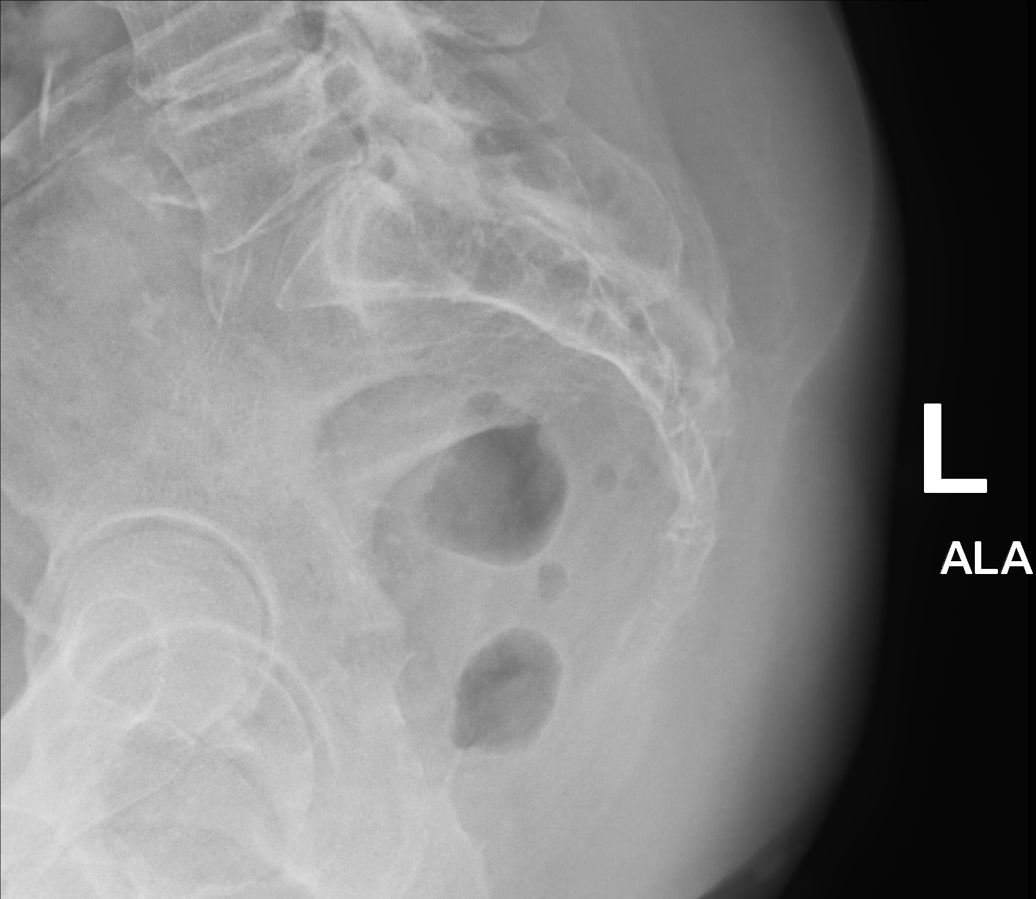

[5 of 5 positions shown; findings below may reference images not displayed]

FINDINGS: Mild dextroscoliosis of the lumbar spine. Lumbar vertebral body
height and alignment appear preserved without fracture or
spondylolisthesis. No spondylolysis visualized. Severe
intervertebral disc space narrowing at L2-L3 and L3-L4. Moderate
narrowing at L4-L5. Facet arthropathy most significant from L4-S1.
IMPRESSION: Advanced degenerative changes of the lumbar spine.

## 2023-12-31 ENCOUNTER — Ambulatory Visit: Admitting: Family

## 2023-12-31 ENCOUNTER — Ambulatory Visit: Payer: Self-pay | Admitting: Family

## 2023-12-31 VITALS — BP 139/74 | HR 70 | Temp 97.5°F | Resp 16 | Ht 61.0 in | Wt 189.0 lb

## 2023-12-31 DIAGNOSIS — I10 Essential (primary) hypertension: Secondary | ICD-10-CM

## 2023-12-31 DIAGNOSIS — Z720 Tobacco use: Secondary | ICD-10-CM

## 2023-12-31 DIAGNOSIS — R7303 Prediabetes: Secondary | ICD-10-CM

## 2023-12-31 DIAGNOSIS — E785 Hyperlipidemia, unspecified: Secondary | ICD-10-CM

## 2023-12-31 DIAGNOSIS — M19049 Primary osteoarthritis, unspecified hand: Secondary | ICD-10-CM | POA: Insufficient documentation

## 2023-12-31 DIAGNOSIS — Z1231 Encounter for screening mammogram for malignant neoplasm of breast: Secondary | ICD-10-CM

## 2023-12-31 DIAGNOSIS — Z13220 Encounter for screening for lipoid disorders: Secondary | ICD-10-CM

## 2023-12-31 DIAGNOSIS — J329 Chronic sinusitis, unspecified: Secondary | ICD-10-CM | POA: Insufficient documentation

## 2023-12-31 DIAGNOSIS — E890 Postprocedural hypothyroidism: Secondary | ICD-10-CM

## 2023-12-31 DIAGNOSIS — Z87891 Personal history of nicotine dependence: Secondary | ICD-10-CM

## 2023-12-31 DIAGNOSIS — E039 Hypothyroidism, unspecified: Secondary | ICD-10-CM

## 2023-12-31 LAB — LIPID PANEL
Cholesterol: 179 mg/dL (ref 0–200)
HDL: 64.5 mg/dL (ref >39.00)
LDL Cholesterol: 100 mg/dL — ABNORMAL HIGH (ref 0–99)
NonHDL: 114.84
Total CHOL/HDL Ratio: 3
Triglycerides: 72 mg/dL (ref 0.0–149.0)
VLDL: 14.4 mg/dL (ref 0.0–40.0)

## 2023-12-31 LAB — BASIC METABOLIC PANEL WITH GFR
BUN: 19 mg/dL (ref 6–23)
CO2: 33 meq/L — ABNORMAL HIGH (ref 19–32)
Calcium: 9.6 mg/dL (ref 8.4–10.5)
Chloride: 101 meq/L (ref 96–112)
Creatinine, Ser: 0.72 mg/dL (ref 0.40–1.20)
GFR: 85.64 mL/min (ref >60.00)
Glucose, Bld: 88 mg/dL (ref 70–99)
Potassium: 4 meq/L (ref 3.5–5.1)
Sodium: 140 meq/L (ref 135–145)

## 2023-12-31 LAB — HEMOGLOBIN A1C: Hgb A1c MFr Bld: 6 % (ref 4.6–6.5)

## 2023-12-31 LAB — TSH: TSH: 8.74 u[IU]/mL — ABNORMAL HIGH (ref 0.35–5.50)

## 2023-12-31 MED ORDER — VARENICLINE TARTRATE (STARTER) 0.5 MG X 11 & 1 MG X 42 PO TBPK: ORAL_TABLET | ORAL | 0 refills | Status: AC

## 2023-12-31 MED ORDER — ATORVASTATIN CALCIUM 10 MG PO TABS: 10.0000 mg | ORAL_TABLET | Freq: Every day | ORAL | 1 refills | Status: AC

## 2023-12-31 MED ORDER — LEVOTHYROXINE SODIUM 137 MCG PO TABS: 137.0000 ug | ORAL_TABLET | Freq: Every day | ORAL | 0 refills | Status: AC

## 2023-12-31 MED ORDER — VARENICLINE TARTRATE 1 MG PO TABS: 1.0000 mg | ORAL_TABLET | Freq: Two times a day (BID) | ORAL | 0 refills | Status: DC

## 2023-12-31 MED ORDER — AMOXICILLIN-POT CLAVULANATE 875-125 MG PO TABS: 1.0000 | ORAL_TABLET | Freq: Two times a day (BID) | ORAL | 0 refills | Status: AC

## 2023-12-31 NOTE — Patient Instructions (Signed)
  VISIT SUMMARY: During your visit, we discussed your persistent sinus issues and hand pain. We also reviewed your current medications and health concerns, including your thyroid  function, blood pressure, and prediabetes status. Additionally, we talked about your smoking habits and potential aids for quitting.  YOUR PLAN: -CHRONIC SINUSITIS: You have been experiencing sinus pain and pressure for two weeks, likely due to a bacterial infection. We have prescribed antibiotics to help clear the infection. Please let us  know if you develop any symptoms of a yeast infection, such as itching or unusual discharge.  -PRIMARY OSTEOARTHRITIS OF HAND: The tenderness and soreness in your left finger is likely due to osteoarthritis, a condition where the cartilage in your joints wears down over time. We recommend taking Tylenol  daily to manage the pain.  -HYPOTHYROIDISM: Your thyroid  function is being managed with levothyroxine . We have ordered a TSH blood test to check your thyroid  levels and will send a refill for Synthroid  after we get the results.  -PREDIABETES: Your previous A1c level was 6.0, which indicates prediabetes, a condition where blood sugar levels are higher than normal but not high enough to be classified as diabetes. We have ordered another A1c blood test to monitor your blood sugar levels.  -ESSENTIAL HYPERTENSION: Your blood pressure is slightly elevated at 139/80 mmHg but is currently acceptable. Please continue with your current blood pressure medications.  -TOBACCO USE: You are currently smoking very little and are interested in quitting. We have prescribed Chantix  to help you stop smoking and advised you to use Good for potential cost savings.  -GENERAL HEALTH MAINTENANCE: You are due for a lung cancer screening CT and a mammogram. We have ordered these tests and updated your immunization records.  INSTRUCTIONS: Please follow up with the ordered blood tests for your thyroid  function and  A1c levels. Schedule your lung cancer screening CT after January 2nd and your mammogram as soon as possible. If you experience any side effects from the antibiotics or Chantix , or if your symptoms persist or worsen, please contact our office.

## 2023-12-31 NOTE — Progress Notes (Signed)
 9  Subjective:     Patient ID: Anna Pratt, female    DOB: Feb 03, 1955, 68 y.o.   MRN: [id number]  Chief Complaint  Patient presents with   Hypertension    Here for follow up   Hypothyroidism    Here for follow up   Hand Pain    Patient reports pain on small finger of left hand   Sinus Problem    Complains of sinus congestion for 2 weeks    HPI  Discussed the use of AI scribe software for clinical note transcription with the patient, who gave verbal consent to proceed.  History of Present Illness Anna Pratt is a 68 year old female who presents with persistent sinus issues and hand pain.  She has been experiencing sinus issues for the past two weeks, which she believes were contracted from her grandson. Initially, the symptoms were severe with significant congestion, especially when lying down, causing discomfort. Despite her grandson's symptoms improving with antibiotics, her symptoms have persisted.  She experiences tenderness and soreness in her left hand, particularly in one finger, which she describes as feeling like a cramp. This has been a constant issue, although she does not recall any specific injury or heavy lifting that could have caused it. Other fingers occasionally experience similar symptoms, but the one finger remains consistently problematic.  She has received both the COVID-19 booster and the flu shot in October. She expresses concern about the deductible cost for her previous lung cancer screening CT.  She is currently taking Synthroid  125 mcg for her thyroid , which was last checked over the summer and was normal. She is also taking amlodipine  and hydrochlorothiazide  for blood pressure management. She takes potassium supplements as well.  Her A1c was last recorded at 6.0, which is in the borderline range for diabetes. She works night shifts at [employer] and is considering reducing her work hours. She smokes minimally and is contemplating  quitting.      Health Maintenance Due  Topic Date Due   Lung Cancer Screening  01/31/2024    Past Medical History:  Diagnosis Date   Abnormal Pap smear of cervix    02-26-20   Cataract    Hypertension    Kidney stone    Osteoarthritis, knee    Thyroid  goiter    Tobacco abuse 08/07/2022    Past Surgical History:  Procedure Laterality Date   ANKLE ARTHROPLASTY Right 2019   done after injury   ANKLE SURGERY Left    s/p injury   CESAREAN SECTION     KIDNEY STONE SURGERY     KNEE SURGERY Right    THYROID  SURGERY     due to goiter 32 years ago    Family History  Problem Relation Age of Onset   Hypertension Father    Heart attack Father    Bone cancer Sister 67   Diabetes Maternal Grandmother     Social History   Socioeconomic History   Marital status: Single    Spouse name: Not on file   Number of children: Not on file   Years of education: Not on file   Highest education level: Some college, no degree  Occupational History   Not on file  Tobacco Use   Smoking status: Every Day    Current packs/day: 0.00    Average packs/day: 0.5 packs/day for 51.4 years (25.7 ttl pk-yrs)    Types: Cigarettes    Start date: 21    Last attempt to quit:  06/21/2019    Years since quitting: 4.5   Smokeless tobacco: Never  Substance and Sexual Activity   Alcohol use: Yes    Comment: socially   Drug use: Never   Sexual activity: Not Currently    Partners: Male    Birth control/protection: Post-menopausal  Other Topics Concern   Not on file  Social History Narrative   Works 3rd shift at [employer] ([employer]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) used to work at [employer]   Here from [place] to be near daughter and grandson   Lives with daughter   Single   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, 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   Enjoys television   Social Drivers of Corporate Investment Banker Strain: Low Risk  (06/25/2023)   Overall Financial Resource Strain (CARDIA)    Difficulty of Paying  Living Expenses: Not hard at all  Food Insecurity: No Food Insecurity (06/25/2023)   Hunger Vital Sign    Worried About Running Out of Food in the Last Year: Never true    Ran Out of Food in the Last Year: Never true  Transportation Needs: No Transportation Needs (06/25/2023)   PRAPARE - Administrator, Civil Service (Medical): No    Lack of Transportation (Non-Medical): No  Physical Activity: Sufficiently Active (06/25/2023)   Exercise Vital Sign    Days of Exercise per Week: 5 days    Minutes of Exercise per Session: 30 min  Stress: No Stress Concern Present (06/25/2023)   [employer] of Occupational Health - Occupational Stress Questionnaire    Feeling of Stress : Not at all  Social Connections: Moderately Integrated (06/25/2023)   Social Connection and Isolation Panel    Frequency of Communication with Friends and Family: Three times a week    Frequency of Social Gatherings with Friends and Family: Once a week    Attends Religious Services: 1 to 4 times per year    Active Member of [employer] or Organizations: Yes    Attends Banker Meetings: 1 to 4 times per year    Marital Status: Never married  Catering Manager Violence: Not on file    Outpatient Medications Prior to Visit  Medication Sig Dispense Refill   amLODipine  (NORVASC ) 5 MG tablet Take 1.5 tablets (7.5 mg total) by mouth daily at 6 (six) AM. 135 tablet 1   betamethasone  dipropionate 0.05 % cream Apply topically 2 (two) times daily. 30 g 0   gabapentin  (NEURONTIN ) 100 MG capsule Take 1 capsule (100 mg total) by mouth 3 (three) times daily. 90 capsule 3   hydrochlorothiazide  (HYDRODIURIL ) 25 MG tablet Take 1 tablet (25 mg total) by mouth daily. 90 tablet 1   levothyroxine  (SYNTHROID ) 125 MCG tablet Take 1 tablet (125 mcg total) by mouth daily before breakfast. 90 tablet 1   Omega-3 Fatty Acids (FISH OIL PO) Take by mouth.     potassium chloride  (KLOR-CON ) 10 MEQ tablet Take 1 tablet (10 mEq total)  by mouth 2 (two) times daily. 180 tablet 1   varenicline  (CHANTIX  CONTINUING MONTH PAK) 1 MG tablet Take 1 tablet (1 mg total) by mouth 2 (two) times daily. 60 tablet 2   No facility-administered medications prior to visit.    No Known Allergies  ROS See HPI    Objective:    Physical Exam Constitutional:      General: She is not in acute distress.    Appearance: Normal appearance. She is well-developed.  HENT:  Head: Normocephalic and atraumatic.     Right Ear: External ear normal.     Left Ear: External ear normal.  Eyes:     General: No scleral icterus. Neck:     Thyroid : No thyromegaly.  Cardiovascular:     Rate and Rhythm: Normal rate and regular rhythm.     Heart sounds: Normal heart sounds. No murmur heard. Pulmonary:     Effort: Pulmonary effort is normal. No respiratory distress.     Breath sounds: Normal breath sounds. No wheezing.  Musculoskeletal:     Cervical back: Neck supple.  Skin:    General: Skin is warm and dry.  Neurological:     Mental Status: She is alert and oriented to person, place, and time.  Psychiatric:        Mood and Affect: Mood normal.        Behavior: Behavior normal.        Thought Content: Thought content normal.        Judgment: Judgment normal.      BP 139/74 (BP Location: Right Arm, Patient Position: Sitting, Cuff Size: Normal)   Pulse 70   Temp (!) 97.5 F (36.4 C) (Oral)   Resp 16   Ht 5' 1 (1.549 m)   Wt 189 lb (85.7 kg)   SpO2 97%   BMI 35.71 kg/m  Wt Readings from Last 3 Encounters:  12/31/23 189 lb (85.7 kg)  06/25/23 178 lb (80.7 kg)  03/26/23 181 lb (82.1 kg)       Assessment & Plan:   Problem List Items Addressed This Visit       Unprioritized   Tobacco abuse   She is interested in trying chantix .  Rx sent.       Relevant Medications   Varenicline  Tartrate, Starter, (CHANTIX  STARTING MONTH PAK) 0.5 MG X 11 & 1 MG X 42 TBPK   Sinusitis - Primary   14 day hx of sinus congestion/pressure. Will  rx with augmentin .       Relevant Medications   amoxicillin -clavulanate (AUGMENTIN ) 875-125 MG tablet   Prediabetes   Lab Results  Component Value Date   HGBA1C 6.0 06/25/2023   HGBA1C 6.1 03/26/2023   HGBA1C 6.3 12/24/2022   Lab Results  Component Value Date   LDLCALC 105 (H) 05/26/2021   CREATININE 0.72 06/25/2023   Borderline will update today.       Relevant Orders   HgB A1c   Post-surgical hypothyroidism   Lab Results  Component Value Date   TSH 2.41 08/16/2023   Stable on synthroid  125 mcg.  Update TSH.       Osteoarthritis of finger   Bilateral small fingers- recommend tylenol  prn pain.       Essential hypertension   BP acceptable.  Stable on amlodipine  and hydrochlorothiazide         Relevant Orders   Basic Metabolic Panel (BMET)   Other Visit Diagnoses       History of tobacco abuse       Relevant Orders   CT CHEST LUNG CA SCREEN LOW DOSE W/O CM     Breast cancer screening by mammogram       Relevant Orders   MM 3D SCREENING MAMMOGRAM BILATERAL BREAST     Hypothyroidism, unspecified type       Relevant Orders   TSH     Screening for lipoid disorders       Relevant Orders   Lipid panel  I have discontinued Nayely M. Berres's varenicline  and varenicline . I am also having her start on amoxicillin-clavulanate and Varenicline  Tartrate (Starter). Additionally, I am having her maintain her Omega-3 Fatty Acids (FISH OIL PO), betamethasone  dipropionate, potassium chloride , amLODipine , hydrochlorothiazide , levothyroxine , and gabapentin .  Meds ordered this encounter  Medications   amoxicillin-clavulanate (AUGMENTIN) 875-125 MG tablet    Sig: Take 1 tablet by mouth 2 (two) times daily.    Dispense:  20 tablet    Refill:  0    Supervising Provider:   DOMENICA BLACKBIRD A [[id number]]   DISCONTD: varenicline  (CHANTIX  CONTINUING MONTH PAK) 1 MG tablet    Sig: Take 1 tablet (1 mg total) by mouth 2 (two) times daily.    Dispense:  60 tablet    Refill:  0     Supervising Provider:   DOMENICA BLACKBIRD A [[id number]]   Varenicline  Tartrate, Starter, (CHANTIX  STARTING MONTH PAK) 0.5 MG X 11 & 1 MG X 42 TBPK    Sig: Take one 0.5 mg tablet by mouth once daily for 3 days, then increase to one 0.5 mg tablet twice daily for 4 days, then increase to one 1 mg tablet twice daily.    Dispense:  53 each    Refill:  0    Please cancel rx for the continuation month pack- that was sent in error. Please fill this instead.    Supervising Provider:   DOMENICA BLACKBIRD A [phone]

## 2023-12-31 NOTE — Assessment & Plan Note (Signed)
 Bilateral small fingers- recommend tylenol  prn pain.

## 2023-12-31 NOTE — Assessment & Plan Note (Signed)
 BP acceptable.  Stable on amlodipine  and hydrochlorothiazide 

## 2023-12-31 NOTE — Telephone Encounter (Signed)
 Her cholesterol looks OK, but given her medical history and smoking history, I would like to have her add atorvastatin once daily to decrease her risk of heart attack and stroke.   Synthroid  needs to be increased as well.  Please increase from 125 mcg to 137 mcg once daily in the AM on an empty stomach. Wait 30 minutes before eating/taking other medication. Repeat TSH and lipids in 6 week.

## 2023-12-31 NOTE — Assessment & Plan Note (Signed)
 14 day hx of sinus congestion/pressure. Will rx with augmentin.

## 2023-12-31 NOTE — Assessment & Plan Note (Signed)
 Lab Results  Component Value Date   HGBA1C 6.0 06/25/2023   HGBA1C 6.1 03/26/2023   HGBA1C 6.3 12/24/2022   Lab Results  Component Value Date   LDLCALC 105 (H) 05/26/2021   CREATININE 0.72 06/25/2023   Borderline will update today.

## 2023-12-31 NOTE — Assessment & Plan Note (Signed)
 Lab Results  Component Value Date   TSH 2.41 08/16/2023   Stable on synthroid  125 mcg.  Update TSH.

## 2023-12-31 NOTE — Assessment & Plan Note (Signed)
 She is interested in trying chantix .  Rx sent.

## 2024-01-06 ENCOUNTER — Inpatient Hospital Stay (HOSPITAL_BASED_OUTPATIENT_CLINIC_OR_DEPARTMENT_OTHER): Admission: RE | Admit: 2024-01-06

## 2024-02-06 ENCOUNTER — Encounter (HOSPITAL_BASED_OUTPATIENT_CLINIC_OR_DEPARTMENT_OTHER): Payer: Self-pay

## 2024-02-06 ENCOUNTER — Ambulatory Visit (HOSPITAL_BASED_OUTPATIENT_CLINIC_OR_DEPARTMENT_OTHER)
Admission: RE | Admit: 2024-02-06 | Discharge: 2024-02-06 | Disposition: A | Discharge status: Home / Self Care | Source: Ambulatory Visit | Attending: Family | Admitting: Family

## 2024-02-06 DIAGNOSIS — Z1231 Encounter for screening mammogram for malignant neoplasm of breast: Secondary | ICD-10-CM | POA: Diagnosis present

## 2024-02-13 ENCOUNTER — Other Ambulatory Visit

## 2024-02-13 DIAGNOSIS — E785 Hyperlipidemia, unspecified: Secondary | ICD-10-CM | POA: Diagnosis not present

## 2024-02-13 DIAGNOSIS — E039 Hypothyroidism, unspecified: Secondary | ICD-10-CM

## 2024-02-13 LAB — LIPID PANEL
Cholesterol: 199 mg/dL (ref 28–200)
HDL: 64.6 mg/dL
LDL Cholesterol: 107 mg/dL — ABNORMAL HIGH (ref 10–99)
NonHDL: 134.15
Total CHOL/HDL Ratio: 3
Triglycerides: 135 mg/dL (ref 10.0–149.0)
VLDL: 27 mg/dL (ref 0.0–40.0)

## 2024-02-13 LAB — TSH: TSH: 3.69 u[IU]/mL (ref 0.35–5.50)

## 2024-02-14 ENCOUNTER — Ambulatory Visit: Payer: Self-pay | Admitting: Family
